# Patient Record
Sex: Male | Born: 1940 | Race: White | Hispanic: No
Health system: Southern US, Community
[De-identification: ages and names within clinical notes are randomized; demographics above are authoritative.]

---

## 2000-11-08 ENCOUNTER — Observation Stay (HOSPITAL_COMMUNITY): Admission: EM | Admit: 2000-11-08 | Discharge: 2000-11-09 | Payer: Self-pay

## 2002-11-08 ENCOUNTER — Ambulatory Visit (HOSPITAL_COMMUNITY): Admission: RE | Admit: 2002-11-08 | Discharge: 2002-11-08 | Payer: Self-pay | Admitting: Gastroenterology

## 2010-05-19 ENCOUNTER — Emergency Department (HOSPITAL_COMMUNITY): Payer: Medicare Other

## 2010-05-19 ENCOUNTER — Inpatient Hospital Stay (HOSPITAL_COMMUNITY)
Admission: EM | Admit: 2010-05-19 | Discharge: 2010-05-21 | DRG: 312 | Disposition: A | Discharge status: Home / Self Care | Payer: Medicare Other | Attending: Cardiology | Admitting: Cardiology

## 2010-05-19 DIAGNOSIS — K219 Gastro-esophageal reflux disease without esophagitis: Secondary | ICD-10-CM | POA: Diagnosis present

## 2010-05-19 DIAGNOSIS — D696 Thrombocytopenia, unspecified: Secondary | ICD-10-CM | POA: Diagnosis present

## 2010-05-19 DIAGNOSIS — R55 Syncope and collapse: Principal | ICD-10-CM | POA: Diagnosis present

## 2010-05-19 DIAGNOSIS — E785 Hyperlipidemia, unspecified: Secondary | ICD-10-CM | POA: Diagnosis present

## 2010-05-19 DIAGNOSIS — I10 Essential (primary) hypertension: Secondary | ICD-10-CM | POA: Diagnosis present

## 2010-05-19 DIAGNOSIS — Z79899 Other long term (current) drug therapy: Secondary | ICD-10-CM

## 2010-05-19 DIAGNOSIS — I4891 Unspecified atrial fibrillation: Secondary | ICD-10-CM | POA: Diagnosis present

## 2010-05-19 DIAGNOSIS — I498 Other specified cardiac arrhythmias: Secondary | ICD-10-CM | POA: Diagnosis present

## 2010-05-19 DIAGNOSIS — Z7982 Long term (current) use of aspirin: Secondary | ICD-10-CM

## 2010-05-19 DIAGNOSIS — R7989 Other specified abnormal findings of blood chemistry: Secondary | ICD-10-CM

## 2010-05-19 LAB — COMPREHENSIVE METABOLIC PANEL
BUN: 12 mg/dL (ref 6–23)
CO2: 26 mEq/L (ref 19–32)
Calcium: 8.8 mg/dL (ref 8.4–10.5)
Chloride: 107 mEq/L (ref 96–112)
Creatinine, Ser: 0.95 mg/dL (ref 0.4–1.5)
GFR calc non Af Amer: >60 mL/min (ref >60)
Glucose, Bld: 119 mg/dL — ABNORMAL HIGH (ref 70–99)
Total Bilirubin: 0.4 mg/dL (ref 0.3–1.2)

## 2010-05-19 LAB — URINALYSIS, ROUTINE W REFLEX MICROSCOPIC
Hgb urine dipstick: NEGATIVE
Nitrite: NEGATIVE
Specific Gravity, Urine: 1.017 (ref 1.005–1.030)
Urobilinogen, UA: 0.2 mg/dL (ref 0.0–1.0)

## 2010-05-19 LAB — DIFFERENTIAL
Basophils Absolute: 0 10*3/uL (ref 0.0–0.1)
Basophils Relative: 1 % (ref 0–1)
Eosinophils Relative: 2 % (ref 0–5)
Lymphocytes Relative: 28 % (ref 12–46)
Neutro Abs: 4 10*3/uL (ref 1.7–7.7)

## 2010-05-19 LAB — CBC
HCT: 45.4 % (ref 39.0–52.0)
RDW: 14.1 % (ref 11.5–15.5)
WBC: 6.5 10*3/uL (ref 4.0–10.5)

## 2010-05-19 LAB — BASIC METABOLIC PANEL
CO2: 28 mEq/L (ref 19–32)
Calcium: 9.1 mg/dL (ref 8.4–10.5)
Creatinine, Ser: 0.93 mg/dL (ref 0.4–1.5)
GFR calc Af Amer: >60 mL/min (ref >60)
GFR calc non Af Amer: >60 mL/min (ref >60)
Sodium: 143 mEq/L (ref 135–145)

## 2010-05-19 LAB — BRAIN NATRIURETIC PEPTIDE: Pro B Natriuretic peptide (BNP): 64 pg/mL (ref 0.0–100.0)

## 2010-05-19 LAB — CK TOTAL AND CKMB (NOT AT ARMC)
CK, MB: 2.5 ng/mL (ref 0.3–4.0)
Relative Index: 2.4 (ref 0.0–2.5)
Total CK: 106 U/L (ref 7–232)

## 2010-05-19 LAB — POCT CARDIAC MARKERS
CKMB, poc: 1.5 ng/mL (ref 1.0–8.0)
Troponin i, poc: 0.09 ng/mL (ref 0.00–0.09)

## 2010-05-19 LAB — CARDIAC PANEL(CRET KIN+CKTOT+MB+TROPI)
CK, MB: 2.3 ng/mL (ref 0.3–4.0)
Total CK: 106 U/L (ref 7–232)

## 2010-05-20 DIAGNOSIS — I517 Cardiomegaly: Secondary | ICD-10-CM

## 2010-05-20 DIAGNOSIS — R55 Syncope and collapse: Secondary | ICD-10-CM

## 2010-05-20 LAB — BASIC METABOLIC PANEL WITH GFR
BUN: 15 mg/dL (ref 6–23)
CO2: 24 meq/L (ref 19–32)
Calcium: 9.3 mg/dL (ref 8.4–10.5)
Chloride: 108 meq/L (ref 96–112)
Creatinine, Ser: 1.03 mg/dL (ref 0.4–1.5)
GFR calc non Af Amer: >60 mL/min
Glucose, Bld: 94 mg/dL (ref 70–99)
Potassium: 4 meq/L (ref 3.5–5.1)
Sodium: 142 meq/L (ref 135–145)

## 2010-05-20 LAB — CARDIAC PANEL(CRET KIN+CKTOT+MB+TROPI)
CK, MB: 1.8 ng/mL (ref 0.3–4.0)
CK, MB: 1.9 ng/mL (ref 0.3–4.0)
Relative Index: 1.7 (ref 0.0–2.5)
Total CK: 114 U/L (ref 7–232)
Troponin I: 0.05 ng/mL (ref 0.00–0.06)

## 2010-05-20 LAB — HEMOGLOBIN A1C: Mean Plasma Glucose: 105 mg/dL (ref <117)

## 2010-05-20 LAB — CBC
HCT: 42.7 % (ref 39.0–52.0)
MCH: 30.9 pg (ref 26.0–34.0)
MCV: 88 fL (ref 78.0–100.0)
Platelets: 137 10*3/uL — ABNORMAL LOW (ref 150–400)
RBC: 4.85 MIL/uL (ref 4.22–5.81)
RDW: 14.3 % (ref 11.5–15.5)
WBC: 6.1 10*3/uL (ref 4.0–10.5)

## 2010-05-20 LAB — HEPARIN LEVEL (UNFRACTIONATED): Heparin Unfractionated: 0.81 IU/mL — ABNORMAL HIGH (ref 0.30–0.70)

## 2010-05-22 ENCOUNTER — Telehealth: Payer: Self-pay | Admitting: Internal Medicine

## 2010-05-25 NOTE — Discharge Summary (Signed)
[name], [name]               ACCOUNT NO.:  [id number]  MEDICAL RECORD NO.:  [id number]           PATIENT TYPE:  I  LOCATION:  4704                         FACILITY:  [facility]  PHYSICIAN:  Melvin Canning. Ladona Ridgel, MD    DATE OF BIRTH:  December 04, 1940  DATE OF ADMISSION:  05/19/2010 DATE OF DISCHARGE:  05/21/2010                              DISCHARGE SUMMARY   PRIMARY CARDIOLOGIST:  Melvin Canning. Ladona Ridgel, MD  PRIMARY CARE PHYSICIAN:  Melvin Penna, MD.  DISCHARGE DIAGNOSES: 1. Syncope (unknown etiology).     a.     Sinus bradycardia down to 44 bpm on telemetry without      significant pauses.     b.     2-D echocardiogram, May 20, 2010:  LV cavity size      normal, moderate LVH, LVEF 55%- 60%, normal wall motion (no      regional wall motion abnormalities), grade 1 diastolic      dysfunction.  Moderately calcified aortic valve leaflets, but no      stenosis.  Left atrium mildly dilated.  RV cavity size normal with  normal systolic function.  Right atrium mildly dilated.  Peak RV-      RA gradient 18 mmHg.  PA systolic pressure 24-28 mmHg.  RA      pressures 6-10 mmHg.     c.     Carotid Dopplers with no evidence of ICA stenosis      bilaterally.     d.     Loop recorder to be set up as an outpatient (the patient to      be contacted by [facility]). 2. Mild elevation of troponin-I (question demand ischemia).     a.     Initial point-of-care markers negative, first full set of      enzymes, CK and MB within normal limits, troponin-I 0.10, two      follow-up sets of enzymes within normal limits. 3. Question history of atrial fibrillation.     a.     No evidence of atrial fibrillation at this admission,      continue preadmission meds. 4. Hypertension, BP on admission 161/94, peak systolic blood pressure     157 after admission, peak diastolic 94.  No med changes at this     time with most BP checks with systolic less than 140 and diastolic     less than 80. 5. Thrombocytopenia  (platelet count 137).  SECONDARY DIAGNOSES: 1. Dyslipidemia. 2. Gastroesophageal reflux disease/esophagitis. 3. Status post tendon surgery, 2010. 4. Status post cholecystectomy.  ALLERGIES AND INTOLERANCES:  NKDA.  PROCEDURES: 1. EKG, May 19, 2010:  Sinus bradycardia, 58 bpm, normal axis,     intervals within normal limits, no significant ST-T wave changes. 2. Chest x-ray, May 19, 2010:  No acute abnormalities, mild     changes consistent with COPD. 3. 2-D echocardiogram, May 20, 2010:  Please see discharge     diagnoses section under #1. 4. Carotid artery Dopplers, May 20, 2010:  Please see discharge     diagnoses section under #1.  HISTORY OF PRESENT ILLNESS:  Mr. [name] is a 70 year old Caucasian gentleman with the above-noted complex medical history, who presented to [facility] ED for further evaluation after having a syncopal episode while getting out of his truck after a long day of hunting coons.  The patient reports having an episode 4 years ago where he was chopped in the wood and had loss of consciousness.  He has occasionally had presyncope with heart fluttering since that time, but no frank syncope. Unfortunately, after a long day of hunting racoon's of his friends, he had driven home and was getting out of his truck when he had sudden onset of syncope without warning nor any post-syncopal symptoms.  He was unconscious for approximately 1 minute per witness report (hunting buddies).  He had no preceding chest discomfort, shortness of breath, heart fluttering/palpitations.  He had no biting of tongue or loss of bowel or bladder nor postictal state.  He told his daughter about this incident, who took him to his primary care doctor.  He was then sent to [facility] ED.  There EKG which showed no acute changes.  Vital signs only significant for mild hypertension.  There is documentation in  E-chart that the patient having a troponin of 0.18, but this is not  corroborated by any other area in E-chart (labs showed max troponin of 0.10 after negative set of point-of-care markers initially drawn).  HOSPITAL COURSE:  The patient was admitted and noted to have mild troponin elevation as above, but at no time did he have any chest discomfort or shortness of breath.  The patient had 2-D echocardiogram completed that was essentially normal with no wall motion abnormalities and normal right and left ventricular systolic function.  The patient was noted to have a bruit on physical exam and therefore had carotid Dopplers completed that showed no evidence of ICA stenosis bilaterally. The patient's telemetry was reviewed carefully by electrophysiologist, Dr. Lewayne Cochran with no significant findings (sinus bradycardia at nadir of 44 bpm and no significant pauses).  Due to the patient's episodes of syncope, we instructed him not to drive for 6 months.  He will also be instructed to call [facility] once he discuss this when would be an appropriate time to have his loop recorder implanted with his daughter.  At the time of discharge, the patient received his preadmission med list unchanged, follow-up instructions, and all questions and concerns were addressed prior to leaving the hospital.  DISCHARGE LABS:  WBC is 6.1, HGB is 15.0, HCT is 42.7, PLT count is 137. D-dimer 0.33.  Sodium 142, potassium 4.0, chloride 108, bicarb 24, BUN 15, creatinine 1.03, glucose 94.  Liver function tests within normal limits.  Albumin is 3.8, calcium is 9.3.  Hemoglobin A1c is 5.3%. Initial set of point-of-care markers negative.  First full set of enzymes, CK 106, MB 2.3, troponin 0.10.  Second full set, CK 99, MB 1.8, troponin 0.05.  Third full sets also within normal limits.  BNP 64.  TSH 2.915.  Urinalysis within normal limits.  FOLLOWUP PLANS AND APPOINTMENTS:  Please see hospital course.  DISCHARGE MEDICATIONS: 1. Enteric-coated aspirin 325 mg p.o. daily. 2.  Diltiazem CD 180 mg p.o. nightly. 3. Omeprazole 20 mg 1 capsule p.o. nightly.  DURATION OF DISCHARGE ENCOUNTER INCLUDING PHYSICIAN TIME:  35 minutes.     Jarrett Ables, PAC   ______________________________ Melvin Canning. Ladona Ridgel, MD    MS/MEDQ  D:  05/21/2010  T:  05/22/2010  Job:  [id number]  cc:   [name],  M.D. Melvin Canning. Ladona Ridgel, MD  Electronically Signed by Jarrett Ables PAC on 05/22/2010 11:03:42 AM Electronically Signed by Melvin Bunting MD on 05/25/2010 03:39:00 PM

## 2010-05-27 NOTE — Progress Notes (Addendum)
Summary: pt wants to cxl surgery for monday  Phone Note Call from Patient   Caller: Patient [phone] Reason for Call: Talk to Nurse Summary of Call: pt states he has surgery scheduled for monday -he wants to cxl-he has figured out what the problem is Initial call taken by: Glynda Jaeger,  May 22, 2010 11:25 AM  Follow-up for Phone Call        [facility] Katina Dung, RN, BSN  May 22, 2010 11:54 AM --I talked with pt--pt states he is scheduled to have a "monitor put in on Monday"--he states he knows  why he passed out and if the doctor wants to know he can call him-pt was scheduled for loop recorder implant at 2pm 05/25/10--I have cancelled this with Arlys John in the main cath lab--I will forward this to Dr Ladona Ridgel for review     Appended Document: pt wants to cxl surgery for monday I also notified Trish that pt cancelled loop implant  for 05/25/10

## 2010-05-29 NOTE — H&P (Signed)
[name], [name] NO.:  [id number]  MEDICAL RECORD NO.:  [id number]           PATIENT TYPE:  I  LOCATION:  4704                         FACILITY:  [facility]  PHYSICIAN:  Marca Ancona, MD      DATE OF BIRTH:  07/21/1940  DATE OF ADMISSION:  05/19/2010 DATE OF DISCHARGE:                             HISTORY & PHYSICAL   PRIMARY CARDIOLOGIST:  Evaluated by Dr. Ladona Ridgel in 2002.  PRIMARY CARE PHYSICIAN:  Dr. Christell Constant.  REASON FOR HOSPITALIZATION:  Syncopal episode with elevated troponins.  HISTORY OF PRESENT ILLNESS:  This is a 70 year old gentleman without known coronary artery disease with history of paroxysmal atrial fibrillation and dyslipidemia who states he was in his usual state of health until yesterday when he had a syncopal episode.  The patient had been [place] hunting all day, walking greater than 500 yards without difficulty.  He did complain of a headache.  After driving back home, he had a sudden syncopal episode while getting out of his truck. The patient was unresponsive for approximately 1 minute.  This is per his "hunting buddies".  He had no prodromal symptoms.  He also had no postictal symptoms, no loss of bowel or difficulty findings words.  The patient says he did feel poorly afterwards, but this resolved in a short period of time and therefore did not go for evaluation.  He denied any associated symptoms of chest pain, shortness of breath or palpitations. He does state that this also happened approximately 4 years ago while he was chopping wood.  He had no workup at this time.  The patient also states that he has occasional spells of lightheadedness.  They are associated with his heart fluttering but no frank syncopal episode. These are relieved on their own.    His daughter took him to see his primary care physician this morning and he was sent via EMS to [facility] for further evaluation.  The patient's initial troponin is elevated at 0.18.   His EKG demonstrates normal sinus rhythm without acute changes.  He is currently pain free and feels fine.  Of note, the patient's daughter states she checked his pulse rate after the syncopal episode yesterday and was noted to have irregular pulse approximately 80 beats per minute with pauses.  On the way from the primary care physician, the patient did receive a 500 mL bolus and is currently hemodynamically stable. Cardiology was asked to admit the patient for further evaluation.  PAST MEDICAL HISTORY: 1. Paroxysmal atrial fibrillation, on diltiazem and aspirin. 2. Paroxysmal atrial tachycardia. 3. Dyslipidemia. 4. Gastroesophageal reflux disease/esophagitis. 5. Status post tendon surgery in 2010 6. Status post cholecystectomy.  SOCIAL HISTORY:  The patient lives in [place].  He denies any tobacco, alcohol or illicit drug use.  He does exercise while hunting.  FAMILY HISTORY:  Noncontributory for early coronary artery disease.  He does have 2 brothers, two are deceased, one by stroke and one by cancer. His daughter has recently been diagnosed with mitral valve prolapse and she also has atrial fibrillation.  ALLERGIES: 1. STATINS causing severe myalgias. 2. LIDOCAINE causing syncope.  HOME MEDICATIONS: 1. Diltiazem 180 mg 1 tablet daily. 2. Antara 130 mg daily. 3. Omeprazole 20 mg daily. 4. Aspirin 325 mg daily.  REVIEW OF SYSTEMS:  All pertinent positives and negatives as stated in the HPI.  All other systems have been reviewed and are negative.  The patient denies any visual changes, weakness or numbness.  PHYSICAL EXAMINATION:  VITAL SIGNS:  Temperature 97.9, pulse 72, respirations 16, blood pressure 161/94, O2 saturation 100% on room air. GENERAL:  This is a well developed, well nourished elderly gentleman. He is in no acute distress.  He appears his stated age. HEENT:  Normal. NECK:  Supple.  Positive right bruits.  Positive elevation of JVP of 8-9 cm. HEART:   Distant heart sounds, bradycardic with S1 and S2.  No murmur, rub or gallop noted.  PMI is normal.  Pulses are 2+ and equal bilaterally. LUNGS:  Clear to auscultation bilaterally without wheezes, rales or rhonchi. ABDOMEN:  Soft, nontender, positive bowel sounds x4. EXTREMITIES:  No clubbing, cyanosis or edema noted. MUSCULOSKELETAL:  No joint deformities or effusions. NEURO:  Alert and oriented x3.  Cranial nerves II through XII grossly intact.  Chest x-ray showed no acute abnormalities.  There are mild changes consistent with COPD.  There are multiple small, mid high densities compatible with pleural fragments.  EKG showing normal sinus rhythm at a rate of 58 beats per minute.  Axis is normal.  Intervals are normal. There are no acute ST-T wave changes.  WBC 6.5, hemoglobin 15.8, hematocrit 45.4, platelets 149.  Sodium 143, potassium 4.2, BUN 13, creatinine 0.93.  Initial point-of-care markers are negative.  Troponin I 0.18, CK 106, CK-MB 2.5.  ASSESSMENT AND PLAN:  This is a 70 year old gentleman with history of paroxysmal atrial fibrillation and dyslipidemia who presents for evaluation of syncope. 1. Syncope.  With the patient's sudden onset of a syncopal episode     there is concern for arrhythmic etiology, bradycardia versus     tachycardia given the patient's history.  With the patient's     troponin being mildly elevated, there is evidence for a true event.     The patient will be admitted to telemetry overnight and if this is     unremarkable, then he will be set up with a Holter monitor.     We will obtain an echocardiogram today for left ventricular     function.  It is interesting that his JVP appears up.  We will also     check a BNP.  With the patient having history of atrial ablation     and CHADS-VAS score of 3 (age, hypertension, and vascular disease:     carotid bruit), if atrial fibrillation is documented, then likely     we will place the patient on Pradaxa  versus Coumadin.  This can be     discussed in further details.  We will continue diltiazem at this     time. 2. Elevated troponins.  The patient is currently without chest pain or     dyspnea on exertion, as his active includes hunting.  Question if     troponin is elevated due to demand ischemia from arrhythmic event     with hypertension.  The patient will be continued on aspirin and      heparin drip will be initiated for now.  We will check 2-D echocardiogram      for wall motion abnormalities as well as cycle cardiac enzymes. 3. Carotid bruits.  This  appears new and we will get carotid Dopplers     for further evaluation.  Further treatment will be dependent upon the above.     Leonette Monarch, PA-C   ______________________________ Marca Ancona, MD    NB/MEDQ  D:  05/19/2010  T:  05/20/2010  Job:  [id number]  cc:   Dr. Ladona Ridgel Dr. Christell Constant  Electronically Signed by Alen Blew P.A. on 05/27/2010 10:14:24 AM Electronically Signed by Marca Ancona MD on 05/29/2010 10:38:02 AM

## 2011-03-05 ENCOUNTER — Ambulatory Visit: Payer: Medicare Other | Admitting: Cardiovascular Disease

## 2011-06-22 ENCOUNTER — Telehealth: Payer: Self-pay | Admitting: Internal Medicine

## 2011-06-22 ENCOUNTER — Encounter: Payer: Self-pay | Admitting: Internal Medicine

## 2011-06-22 NOTE — Telephone Encounter (Signed)
06-22-11 sent pt past due letter, needs pacer ck with taylor/mt

## 2011-11-25 ENCOUNTER — Telehealth: Payer: Self-pay | Admitting: Internal Medicine

## 2011-11-25 ENCOUNTER — Encounter: Payer: Self-pay | Admitting: Internal Medicine

## 2011-11-25 NOTE — Telephone Encounter (Signed)
11-25-11 sent past due letter pacer ck with taylor/mt

## 2012-01-26 ENCOUNTER — Other Ambulatory Visit (HOSPITAL_COMMUNITY): Payer: Medicare Other

## 2012-01-26 ENCOUNTER — Other Ambulatory Visit: Payer: Self-pay | Admitting: Family Medicine

## 2012-01-26 ENCOUNTER — Ambulatory Visit (HOSPITAL_COMMUNITY)
Admission: RE | Admit: 2012-01-26 | Discharge: 2012-01-26 | Disposition: A | Discharge status: Home / Self Care | Payer: Medicare Other | Source: Ambulatory Visit | Attending: Family Medicine | Admitting: Family Medicine

## 2012-01-26 DIAGNOSIS — R079 Chest pain, unspecified: Secondary | ICD-10-CM | POA: Insufficient documentation

## 2012-01-26 DIAGNOSIS — S0003XA Contusion of scalp, initial encounter: Secondary | ICD-10-CM | POA: Insufficient documentation

## 2012-01-26 DIAGNOSIS — S1093XA Contusion of unspecified part of neck, initial encounter: Secondary | ICD-10-CM | POA: Insufficient documentation

## 2012-01-26 DIAGNOSIS — T1490XA Injury, unspecified, initial encounter: Secondary | ICD-10-CM

## 2012-01-26 DIAGNOSIS — R51 Headache: Secondary | ICD-10-CM | POA: Insufficient documentation

## 2012-01-26 DIAGNOSIS — W1809XA Striking against other object with subsequent fall, initial encounter: Secondary | ICD-10-CM | POA: Insufficient documentation

## 2012-01-26 DIAGNOSIS — M25519 Pain in unspecified shoulder: Secondary | ICD-10-CM | POA: Insufficient documentation

## 2012-09-25 ENCOUNTER — Ambulatory Visit: Payer: Self-pay | Admitting: Family Medicine

## 2012-11-22 ENCOUNTER — Other Ambulatory Visit: Payer: Self-pay | Admitting: *Deleted

## 2012-11-22 MED ORDER — OMEPRAZOLE 20 MG PO CPDR: 20.0000 mg | DELAYED_RELEASE_CAPSULE | Freq: Every day | ORAL | Status: AC

## 2012-11-22 MED ORDER — DILTIAZEM HCL ER COATED BEADS 180 MG PO CP24: 180.0000 mg | ORAL_CAPSULE | Freq: Every day | ORAL | Status: AC

## 2013-10-02 ENCOUNTER — Other Ambulatory Visit: Payer: Self-pay | Admitting: Family Medicine

## 2013-11-17 IMAGING — CR DG FOOT COMPLETE 3+V*L*
3 series · 3 of 3 positions shown · non-contrast
Comparison: None.

CLINICAL DATA: Heel pain, fourth toe pain.

LEFT FOOT - COMPLETE 3+ VIEW

[view not recorded (1 of 3)]
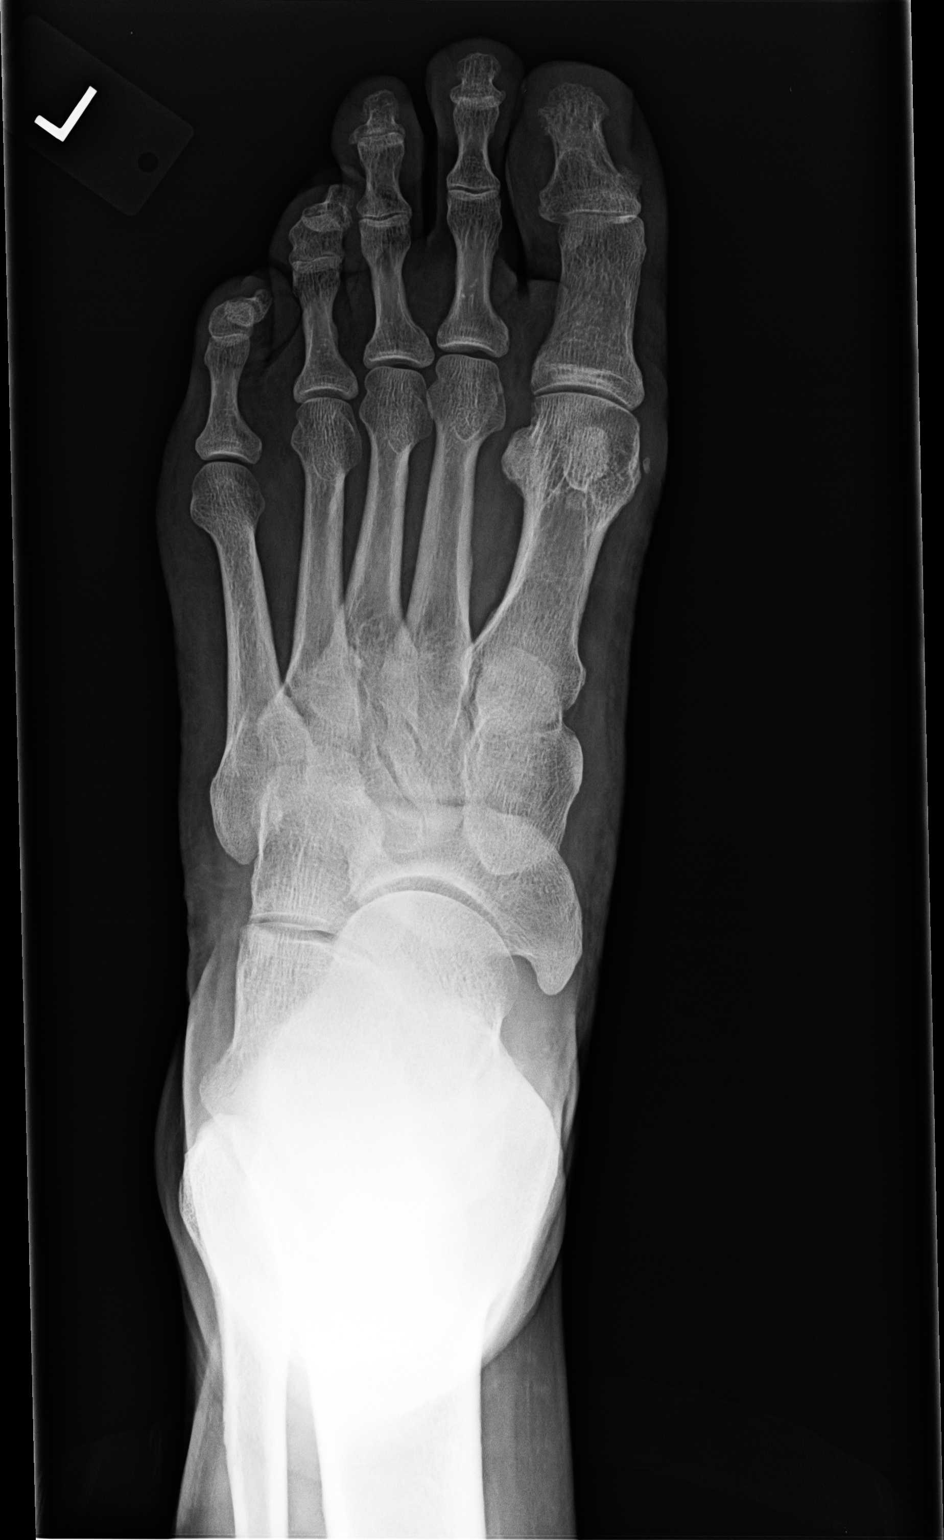

[view not recorded (2 of 3)]
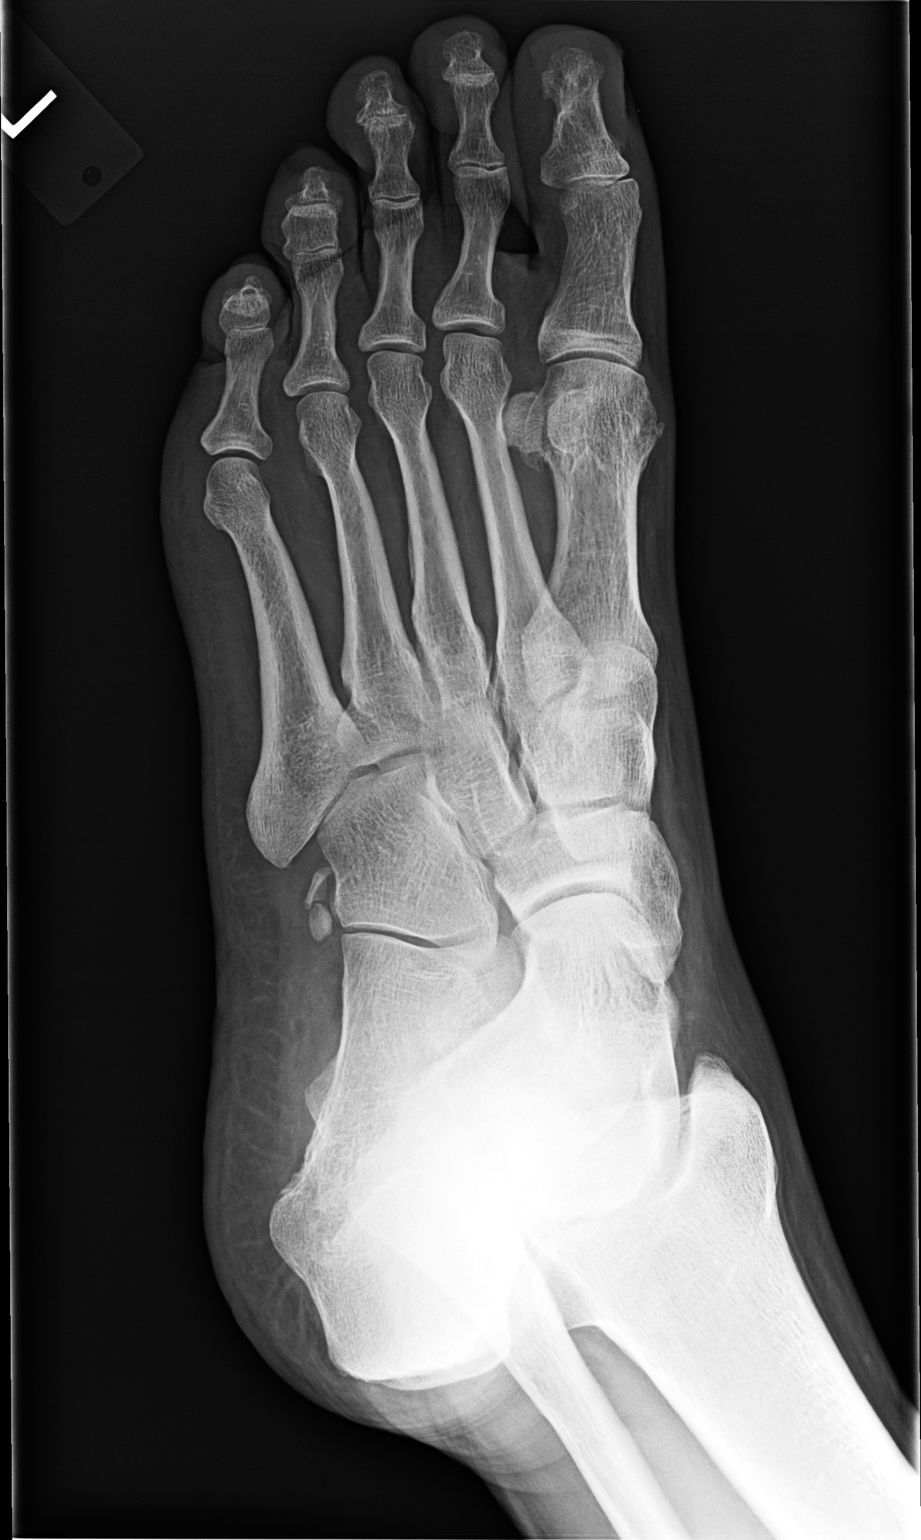

[view not recorded (3 of 3)]
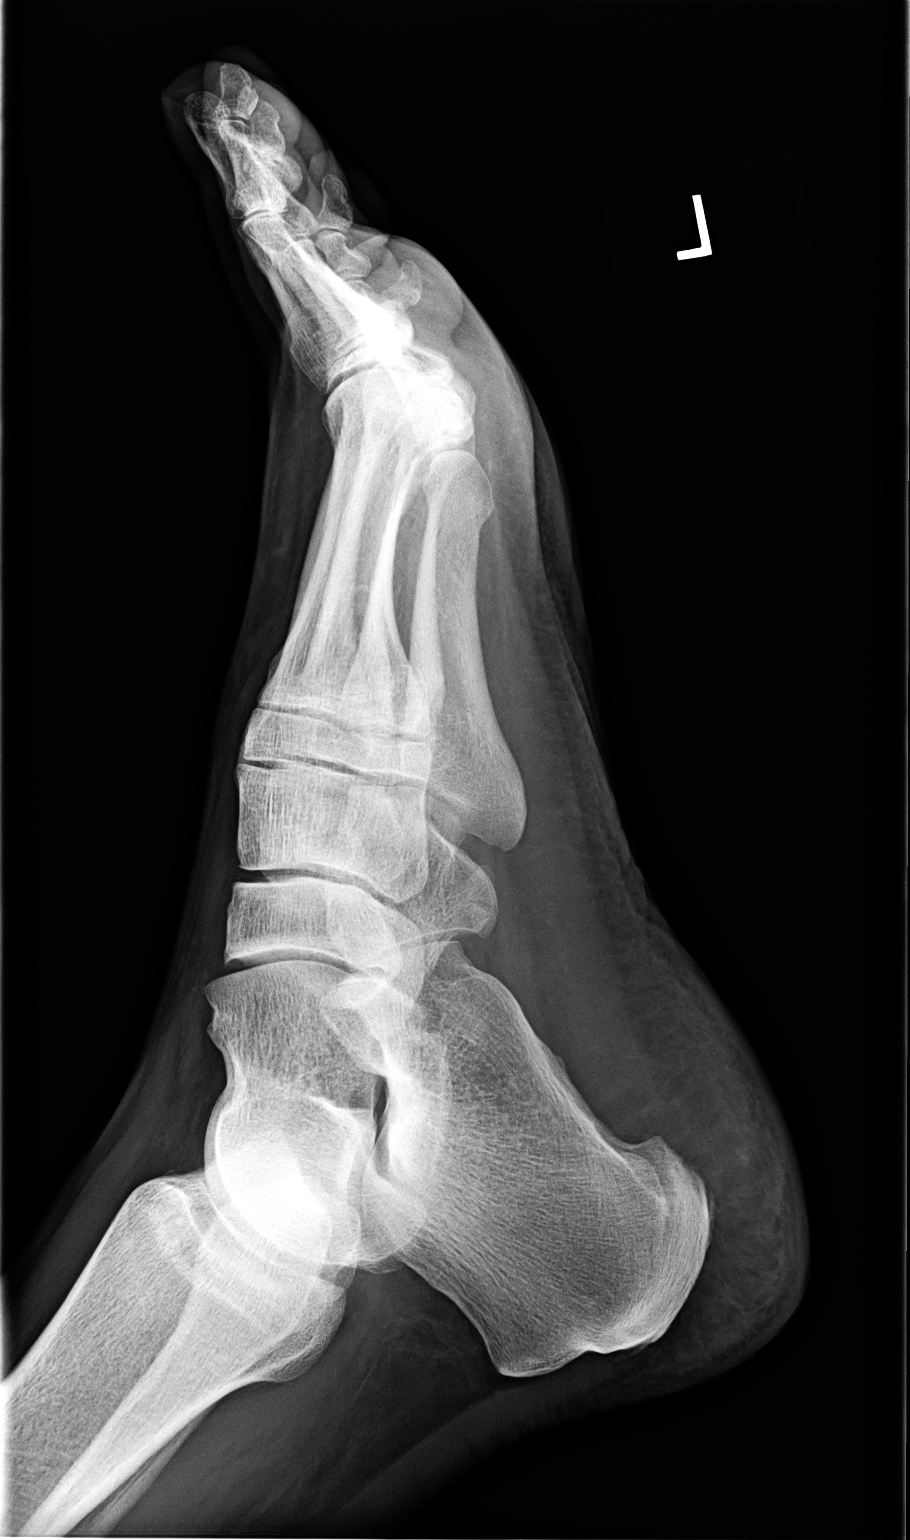

[3 of 3 positions shown; findings below may reference images not displayed]

FINDINGS: Early degenerative changes in the first MTP and IP joint
with joint space narrowing. No acute bony abnormality.
Specifically, no fracture, subluxation, or dislocation.  Soft
tissues are intact.
IMPRESSION: No acute bony abnormality.

## 2014-10-18 ENCOUNTER — Other Ambulatory Visit: Payer: Self-pay | Admitting: Family Medicine

## 2020-06-13 HISTORY — PX: TOTAL KNEE ARTHROPLASTY: SHX125

## 2020-06-25 ENCOUNTER — Other Ambulatory Visit: Payer: Self-pay

## 2020-06-25 ENCOUNTER — Encounter: Payer: Self-pay | Admitting: Physical Therapy

## 2020-06-25 ENCOUNTER — Ambulatory Visit: Payer: Medicare HMO | Attending: Orthopedic Surgery | Admitting: Physical Therapy

## 2020-06-25 DIAGNOSIS — M6281 Muscle weakness (generalized): Secondary | ICD-10-CM | POA: Insufficient documentation

## 2020-06-25 DIAGNOSIS — M25662 Stiffness of left knee, not elsewhere classified: Secondary | ICD-10-CM | POA: Diagnosis present

## 2020-06-25 DIAGNOSIS — R600 Localized edema: Secondary | ICD-10-CM | POA: Diagnosis present

## 2020-06-25 DIAGNOSIS — M25562 Pain in left knee: Secondary | ICD-10-CM

## 2020-06-25 NOTE — Therapy (Signed)
[facility] [address] [place], [place], [address] Phone: [phone]   Fax:  [phone]  Physical Therapy Evaluation  Patient Details  Name: [name] MRN: [id number] Date of Birth: 1940/06/08 Referring Provider (PT): Gean Birchwood, MD   Encounter Date: 06/25/2020   PT End of Session - 06/25/20 1216    Visit Number 1    Number of Visits 12    Date for PT Re-Evaluation 08/13/20    Authorization Type [employer] (CQ and KX modifier); FOTO; Progress note every 10th visit    PT Start Time 1030    PT Stop Time 1114    PT Time Calculation (min) 44 min    Equipment Utilized During Treatment Other (comment)   rolling walker   Activity Tolerance Patient tolerated treatment well    Behavior During Therapy [facility] for tasks assessed/performed           History reviewed. No pertinent past medical history.  Past Surgical History:  Procedure Laterality Date  . 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    Subjective Assessment - 06/25/20 1157    Subjective COVID-19 screening performed upon arrival. Patient arrives to physical therapy with left knee pain, decreased left knee ROM, and difficulty walking secondary to a left TKA on 06/13/2020. Patient reports having home health PT with reports of being consistent with HEP. Patient reports intermittently walking without his walker but holds onto walls. Patient reports pain at worst as severe and pain at best as 0/10. Patient's goals are to decrease pain, improve movement, and improve ability to perform ADLs and home activities independently.    Pertinent History L TKA 06/13/2020    Limitations Walking;Standing;House hold activities;Sitting    Patient Stated Goals get around better,    Currently in Pain? Yes    Pain Score 5     Pain Location Knee    Pain Orientation Left    Pain Descriptors / Indicators Sore    Pain Type Surgical pain    Pain  Onset 1 to 4 weeks ago    Pain Frequency Constant    Aggravating Factors  "bumping it"    Pain Relieving Factors "sitting down, tylenol"    Effect of Pain on Daily Activities "can't do everything like I used to"              [facility] PT Assessment - 06/25/20 0001      Assessment   Medical Diagnosis S/P left total knee replacement    Referring Provider (PT) Gean Birchwood, MD    Onset Date/Surgical Date 06/13/20    Next MD Visit April 2022    Prior Therapy no      Precautions   Precautions Other (comment)    Precaution Comments no ultrasound      Restrictions   Weight Bearing Restrictions No      Balance Screen   Has the patient fallen in the past 6 months No    Has the patient had a decrease in activity level because of a fear of falling?  No    Is the patient reluctant to leave their home because of a fear of falling?  No      Home Environment   Living Environment Private residence    Type of Home House    Home Access Stairs to enter    Entrance Stairs-Number of Steps 2      Prior Function   Level  of Independence Independent with basic ADLs      Observation/Other Assessments   Focus on Therapeutic Outcomes (FOTO)  59% limitation      ROM / Strength   AROM / PROM / Strength AROM;PROM      AROM   Overall AROM  Deficits;Due to pain    AROM Assessment Site Knee    Right/Left Knee Left    Left Knee Extension 10    Left Knee Flexion 88      PROM   Overall PROM  Deficits;Due to pain    PROM Assessment Site Knee    Right/Left Knee Left    Left Knee Extension 8    Left Knee Flexion 95      Palpation   Patella mobility decreased patella mobility in all planes    Palpation comment tender to palpations throughout L knee      Transfers   Comments slow transfers sit <> stand; slow sit <> supine transfers, able to raise left LE onto table without assistance.      Ambulation/Gait   Assistive device Rolling walker    Gait Pattern Step-to pattern;Decreased step length -  right;Decreased stance time - left;Decreased stride length;Decreased hip/knee flexion - left;Decreased dorsiflexion - left;Decreased weight shift to left;Left flexed knee in stance;Antalgic;Trunk flexed                      Objective measurements completed on examination: See above findings.       [facility] Adult PT Treatment/Exercise - 06/25/20 0001      Modalities   Modalities Vasopneumatic      Vasopneumatic   Number Minutes Vasopneumatic  15 minutes    Vasopnuematic Location  Knee    Vasopneumatic Pressure Low    Vasopneumatic Temperature  34 pain and edema                  PT Education - 06/25/20 1214    Education Details Continue HEP provided by home health PT, more frequently 2-3x per day    Person(s) Educated Patient    Methods Explanation    Comprehension Verbalized understanding            PT Short Term Goals - 06/25/20 1305      PT SHORT TERM GOAL #1   Title Patient will be independent with initial HEP    Time 3    Period Weeks    Status New      PT SHORT TERM GOAL #2   Title Patient will demonstrate 8-90+ degrees of left knee AROM to improve mobility during functional tasks    Time 3    Period Weeks    Status New             PT Long Term Goals - 06/25/20 1306      PT LONG TERM GOAL #1   Title Patient will be independent with advanced HEP    Time 6    Period Weeks    Status New      PT LONG TERM GOAL #2   Title Patient will demonstrate 5 degrees or less of left knee extension AROM to improve gait mechanics.    Time 6    Period Weeks    Status New      PT LONG TERM GOAL #3   Title Patient will demonstrate 115+ degrees of left knee flexion AROM to improve ability to perform functional tasks.    Time 6    Period  Weeks    Status New      PT LONG TERM GOAL #4   Title Patient will report ability to perform ADLs and home activities with left knee pain less than or equal to 3/10,    Time 6    Period Weeks    Status New       PT LONG TERM GOAL #5   Title Patient will demonstrate reciprocating stair negotiation with no AD and use of one railing to safely enter/exit home.    Time 6    Period Weeks    Status New                  Plan - 06/25/20 1217    Clinical Impression Statement Patient is an 80 year old male who presents to physical therapy with left knee pain, decreased left ROM, and increased edema secondary to a left TKA on 06/13/2020. Patient ambulates with a rolling walker with decreased L stance time, decreased L knee flexion during swing, and increased L knee flexion during stance. Patient with notable edema extending to lower leg with no compression stockings donned. Some erythema but no heat radiating from left LE. Patient and PT discussed plan of care, HEP, and importance of utilizing walker for safety to prevent falls. Patient reported understanding. Patient would benefit from skilled physical therapy to address deficits and address patient's goals.    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    Examination-Activity Limitations Bathing;Locomotion Level;Transfers;Sit;Squat;Stairs;Stand;Bend;Dressing;Hygiene/Grooming    Examination-Participation Restrictions Cleaning;Laundry    Stability/Clinical Decision Making Stable/Uncomplicated    Clinical Decision Making Low    Rehab Potential Good    PT Frequency 2x / week    PT Duration 6 weeks    PT Treatment/Interventions ADLs/Self Care Home Management;Cryotherapy;Electrical Stimulation;Moist Heat;Iontophoresis 4mg /ml Dexamethasone;Gait training;Stair training;Functional mobility training;Therapeutic activities;Therapeutic exercise;Balance training;Neuromuscular re-education;Manual techniques;Passive range of motion;Patient/family education;Vasopneumatic Device    PT Next Visit Plan Nustep, knee flexion and extension AROM and PROM. gait training, modalities PRN  for pain relief    PT Home Exercise Plan continue HEP from home health    Consulted and Agree with Plan of Care Patient           Patient will benefit from skilled therapeutic intervention in order to improve the following deficits and impairments:  Decreased activity tolerance,Decreased balance,Decreased strength,Increased edema,Difficulty walking,Decreased range of motion,Pain  Visit Diagnosis: Acute pain of left knee  Stiffness of left knee, not elsewhere classified  Localized edema  Muscle weakness (generalized)     Problem List There are no problems to display for this patient.   , PT, DPT 06/25/2020, 6:14 PM  [facility] [address] [place], [place], [place] Phone: [phone]   Fax:  [phone]  Name: [name] MRN: Toma Copier Date of Birth: 12-23-1940

## 2020-06-27 ENCOUNTER — Other Ambulatory Visit: Payer: Self-pay

## 2020-06-27 ENCOUNTER — Ambulatory Visit: Payer: Medicare HMO | Admitting: *Deleted

## 2020-06-27 DIAGNOSIS — M25562 Pain in left knee: Secondary | ICD-10-CM | POA: Diagnosis not present

## 2020-06-27 DIAGNOSIS — M6281 Muscle weakness (generalized): Secondary | ICD-10-CM

## 2020-06-27 DIAGNOSIS — R600 Localized edema: Secondary | ICD-10-CM

## 2020-06-27 DIAGNOSIS — M25662 Stiffness of left knee, not elsewhere classified: Secondary | ICD-10-CM

## 2020-06-27 NOTE — Therapy (Signed)
[facility] [address] [place], [place], [address] Phone: [phone]   Fax:  [phone]  Physical Therapy Treatment  Patient Details  Name: KIT MOLLETT MRN: [id number] Date of Birth: 18-Aug-1940 Referring Provider (PT): Gean Birchwood, MD   Encounter Date: 06/27/2020   PT End of Session - 06/27/20 0917    Visit Number 2    Number of Visits 12    Date for PT Re-Evaluation 08/13/20    PT Start Time 0900    PT Stop Time 0950    PT Time Calculation (min) 50 min           No past medical history on file.  Past Surgical History:  Procedure Laterality Date  . 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   Subjective Assessment - 06/27/20 0915    Subjective COVID-19 screening performed upon arrival.  My knee hurt all night    Pertinent History L TKA 06/13/2020    Limitations Walking;Standing;House hold activities;Sitting    Patient Stated Goals get around better,    Currently in Pain? Yes    Pain Score 5     Pain Location Knee    Pain Orientation Left    Pain Descriptors / Indicators Sore    Pain Type Surgical pain                             [facility] Adult PT Treatment/Exercise - 06/27/20 0001      Exercises   Exercises Knee/Hip      Knee/Hip Exercises: Aerobic   Nustep L1 seat 12,11 x15 mins      Knee/Hip Exercises: Standing   Rocker Board 3 minutes    Other Standing Knee Exercises 8in box toe taps x 10, 12in boxx10, 14 in box x10      Knee/Hip Exercises: Seated   Long Arc Quad AROM;Left;1 set;10 reps   5 sec holds     Modalities   Modalities Vasopneumatic      Vasopneumatic   Number Minutes Vasopneumatic  10 minutes    Vasopnuematic Location  Knee    Vasopneumatic Pressure Low    Vasopneumatic Temperature  34 pain and edema      Manual Therapy   Manual Therapy Passive ROM    Passive ROM PROM toLT knee  for extension and flexion, patella mobs,  scar massage                    PT Short Term Goals - 06/25/20 1305      PT SHORT TERM GOAL #1   Title Patient will be independent with initial HEP    Time 3    Period Weeks    Status New      PT SHORT TERM GOAL #2   Title Patient will demonstrate 8-90+ degrees of left knee AROM to improve mobility during functional tasks    Time 3    Period Weeks    Status New             PT Long Term Goals - 06/25/20 1306      PT LONG TERM GOAL #1   Title Patient will be independent with advanced HEP    Time 6    Period Weeks    Status New      PT LONG TERM GOAL #2   Title Patient will demonstrate 5 degrees or less  of left knee extension AROM to improve gait mechanics.    Time 6    Period Weeks    Status New      PT LONG TERM GOAL #3   Title Patient will demonstrate 115+ degrees of left knee flexion AROM to improve ability to perform functional tasks.    Time 6    Period Weeks    Status New      PT LONG TERM GOAL #4   Title Patient will report ability to perform ADLs and home activities with left knee pain less than or equal to 3/10,    Time 6    Period Weeks    Status New      PT LONG TERM GOAL #5   Title Patient will demonstrate reciprocating stair negotiation with no AD and use of one railing to safely enter/exit home.    Time 6    Period Weeks    Status New                 Plan - 06/27/20 0957    Clinical Impression Statement Pt arrived today doing fairly well with LT knee. Rx focused on ROM and LT LE strengthening. Vaso end of session.    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    Examination-Activity Limitations Bathing;Locomotion Level;Transfers;Sit;Squat;Stairs;Stand;Bend;Dressing;Hygiene/Grooming    Examination-Participation Restrictions Cleaning;Laundry    Stability/Clinical Decision Making Stable/Uncomplicated    Rehab Potential Good    PT  Frequency 2x / week    PT Duration 6 weeks    PT Treatment/Interventions ADLs/Self Care Home Management;Cryotherapy;Electrical Stimulation;Moist Heat;Iontophoresis 4mg /ml Dexamethasone;Gait training;Stair training;Functional mobility training;Therapeutic activities;Therapeutic exercise;Balance training;Neuromuscular re-education;Manual techniques;Passive range of motion;Patient/family education;Vasopneumatic Device    PT Next Visit Plan Nustep, knee flexion and extension AROM and PROM. gait training, modalities PRN for pain relief    PT Home Exercise Plan continue HEP from home health    Consulted and Agree with Plan of Care Patient           Patient will benefit from skilled therapeutic intervention in order to improve the following deficits and impairments:  Decreased activity tolerance,Decreased balance,Decreased strength,Increased edema,Difficulty walking,Decreased range of motion,Pain  Visit Diagnosis: Acute pain of left knee  Stiffness of left knee, not elsewhere classified  Localized edema  Muscle weakness (generalized)     Problem List There are no problems to display for this patient.   Natasja Niday,CHRIS, PTA 06/27/2020, 10:11 AM  [facility] [address] [place], [place], [place] Phone: [phone]   Fax:  [phone]  Name: JEET SHOUGH MRN: Toma Copier Date of Birth: Aug 14, 1940

## 2020-07-01 ENCOUNTER — Other Ambulatory Visit: Payer: Self-pay

## 2020-07-01 ENCOUNTER — Ambulatory Visit: Payer: Medicare HMO | Admitting: Physical Therapy

## 2020-07-01 DIAGNOSIS — M25662 Stiffness of left knee, not elsewhere classified: Secondary | ICD-10-CM

## 2020-07-01 DIAGNOSIS — R600 Localized edema: Secondary | ICD-10-CM

## 2020-07-01 DIAGNOSIS — M25562 Pain in left knee: Secondary | ICD-10-CM

## 2020-07-01 DIAGNOSIS — M6281 Muscle weakness (generalized): Secondary | ICD-10-CM

## 2020-07-01 NOTE — Therapy (Signed)
[facility] [address] [place], [place], [address] Phone: [phone]   Fax:  [phone]  Physical Therapy Treatment  Patient Details  Name: [name] MRN: [id number] Date of Birth: Jun 19, 1940 Referring Provider (PT): Gean Birchwood, MD   Encounter Date: 07/01/2020   PT End of Session - 07/01/20 1709    Visit Number 3    Number of Visits 12    Date for PT Re-Evaluation 08/13/20    Authorization Type [employer] (CQ and KX modifier); FOTO; Progress note every 10th visit     PT Start Time 1600    PT Stop Time 1648    PT Time Calculation (min) 48 min    Activity Tolerance Patient tolerated treatment well    Behavior During Therapy [facility] for tasks assessed/performed           No past medical history on file.  Past Surgical History:  Procedure Laterality Date  . 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   Subjective Assessment - 07/01/20 1613    Subjective COVID-19 screening performed upon arrival. Patient reports knee hurts all the time; reports "light" pain    Pertinent History L TKA 06/13/2020    Limitations Walking;Standing;House hold activities;Sitting    Currently in Pain? Yes   did not provide number on pain scale             [facility] PT Assessment - 07/01/20 0001      Assessment   Medical Diagnosis S/P left total knee replacement    Referring Provider (PT) Gean Birchwood, MD    Onset Date/Surgical Date 06/13/20    Next MD Visit April 2022    Prior Therapy no      Precautions   Precautions Other (comment)    Precaution Comments no ultrasound      Restrictions   Weight Bearing Restrictions No                         [facility] Adult PT Treatment/Exercise - 07/01/20 0001      Exercises   Exercises Knee/Hip      Knee/Hip Exercises: Aerobic   Nustep L2 seat 12,10 x15 mins      Knee/Hip Exercises: Standing   Forward Lunges Left;2 sets;10 reps;3  seconds    Forward Lunges Limitations 6" foward knee flexion    Hip Abduction AROM;Both;1 set;15 reps      Knee/Hip Exercises: Seated   Long Arc Quad AROM;Left;2 sets;10 reps   3" hold     Modalities   Modalities Vasopneumatic      Vasopneumatic   Number Minutes Vasopneumatic  10 minutes    Vasopnuematic Location  Knee    Vasopneumatic Pressure Low    Vasopneumatic Temperature  34 pain and edema      Manual Therapy   Manual Therapy Passive ROM    Passive ROM PROM to LT knee  for extension and flexion, patella mobs, scar massage, intermittent oscillations to decrease pain                    PT Short Term Goals - 06/25/20 1305      PT SHORT TERM GOAL #1   Title Patient will be independent with initial HEP    Time 3    Period Weeks    Status New      PT SHORT TERM GOAL #2   Title Patient will  demonstrate 8-90+ degrees of left knee AROM to improve mobility during functional tasks    Time 3    Period Weeks    Status New             PT Long Term Goals - 06/25/20 1306      PT LONG TERM GOAL #1   Title Patient will be independent with advanced HEP    Time 6    Period Weeks    Status New      PT LONG TERM GOAL #2   Title Patient will demonstrate 5 degrees or less of left knee extension AROM to improve gait mechanics.    Time 6    Period Weeks    Status New      PT LONG TERM GOAL #3   Title Patient will demonstrate 115+ degrees of left knee flexion AROM to improve ability to perform functional tasks.    Time 6    Period Weeks    Status New      PT LONG TERM GOAL #4   Title Patient will report ability to perform ADLs and home activities with left knee pain less than or equal to 3/10,    Time 6    Period Weeks    Status New      PT LONG TERM GOAL #5   Title Patient will demonstrate reciprocating stair negotiation with no AD and use of one railing to safely enter/exit home.    Time 6    Period Weeks    Status New                 Plan -  07/01/20 1644    Clinical Impression Statement Patient arrives to physical therapy with constant "light" pain in left knee. Patient guided through standing and seated exercises with good response. PROM to left knee into flexion and extension performed in sitting with good response and minimal guarding. Patient and PT discussed importance of establishing proper gait mechanics while walking with and without AD. Patient reported understanding. No adverse affects upon removal of modalities.    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    Examination-Activity Limitations Bathing;Locomotion Level;Transfers;Sit;Squat;Stairs;Stand;Bend;Dressing;Hygiene/Grooming    Examination-Participation Restrictions Cleaning;Laundry    Stability/Clinical Decision Making Stable/Uncomplicated    Clinical Decision Making Low    Rehab Potential Good    PT Frequency 2x / week    PT Duration 6 weeks    PT Treatment/Interventions ADLs/Self Care Home Management;Cryotherapy;Electrical Stimulation;Moist Heat;Iontophoresis 4mg /ml Dexamethasone;Gait training;Stair training;Functional mobility training;Therapeutic activities;Therapeutic exercise;Balance training;Neuromuscular re-education;Manual techniques;Passive range of motion;Patient/family education;Vasopneumatic Device    PT Next Visit Plan Nustep, knee flexion and extension AROM and PROM. gait training, modalities PRN for pain relief    PT Home Exercise Plan continue HEP from home health    Consulted and Agree with Plan of Care Patient           Patient will benefit from skilled therapeutic intervention in order to improve the following deficits and impairments:  Decreased activity tolerance,Decreased balance,Decreased strength,Increased edema,Difficulty walking,Decreased range of motion,Pain  Visit Diagnosis: Acute pain of left knee  Stiffness of left knee, not elsewhere  classified  Localized edema  Muscle weakness (generalized)     Problem List There are no problems to display for this patient.   , PT, DPT 07/01/2020, 5:09 PM  [facility] [address] [place], [place], [place] Phone: [phone]   Fax:  [phone]  Name: WANDELL SCULLION MRN: [id number] Date of Birth: 09-Oct-1940

## 2020-07-03 ENCOUNTER — Other Ambulatory Visit: Payer: Self-pay

## 2020-07-03 ENCOUNTER — Ambulatory Visit: Payer: Medicare HMO | Admitting: Physical Therapy

## 2020-07-03 DIAGNOSIS — M25562 Pain in left knee: Secondary | ICD-10-CM | POA: Diagnosis not present

## 2020-07-03 DIAGNOSIS — M6281 Muscle weakness (generalized): Secondary | ICD-10-CM

## 2020-07-03 DIAGNOSIS — M25662 Stiffness of left knee, not elsewhere classified: Secondary | ICD-10-CM

## 2020-07-03 DIAGNOSIS — R600 Localized edema: Secondary | ICD-10-CM

## 2020-07-03 NOTE — Therapy (Signed)
[facility] [address] [place], [place], [address] Phone: [phone]   Fax:  [phone]  Physical Therapy Treatment  Patient Details  Name: [name] MRN: [id number] Date of Birth: Jun 18, 1940 Referring Provider (PT): Gean Birchwood, MD   Encounter Date: 07/03/2020   PT End of Session - 07/03/20 1652    Visit Number 4    Number of Visits 12    Date for PT Re-Evaluation 08/13/20    Authorization Type [employer] (CQ and KX modifier); FOTO; Progress note every 10th visit     PT Start Time 1550    PT Stop Time 1640    PT Time Calculation (min) 50 min    Activity Tolerance Patient tolerated treatment well    Behavior During Therapy [facility] for tasks assessed/performed           No past medical history on file.  Past Surgical History:  Procedure Laterality Date  . 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   Subjective Assessment - 07/03/20 1724    Subjective COVID-19 screening performed upon arrival. Patient reports feeling no pain during the day, but pain keeps him up at night.    Pertinent History L TKA 06/13/2020    Limitations Walking;Standing;House hold activities;Sitting    Patient Stated Goals get around better,    Currently in Pain? No/denies              [facility] PT Assessment - 07/03/20 0001      Assessment   Medical Diagnosis S/P left total knee replacement    Referring Provider (PT) Gean Birchwood, MD    Onset Date/Surgical Date 06/13/20    Next MD Visit April 2022    Prior Therapy no      Precautions   Precautions Other (comment)    Precaution Comments no ultrasound                         [facility] Adult PT Treatment/Exercise - 07/03/20 0001      Exercises   Exercises Knee/Hip      Knee/Hip Exercises: Aerobic   Recumbent Bike attempted but terminated    Nustep L2 seat 12,10 x15 mins      Knee/Hip Exercises: Standing   Forward Lunges  Left;2 sets;10 reps;3 seconds    Forward Lunges Limitations 14" forward knee flexion    Rocker Board 3 minutes      Modalities   Modalities Vasopneumatic      Vasopneumatic   Number Minutes Vasopneumatic  10 minutes    Vasopnuematic Location  Knee    Vasopneumatic Pressure Low    Vasopneumatic Temperature  34 pain and edema      Manual Therapy   Manual Therapy Passive ROM    Passive ROM PROM to LT knee  for extension and flexion, patella mobs, scar massage, intermittent oscillations to decrease pain in sitting                    PT Short Term Goals - 06/25/20 1305      PT SHORT TERM GOAL #1   Title Patient will be independent with initial HEP    Time 3    Period Weeks    Status New      PT SHORT TERM GOAL #2   Title Patient will demonstrate 8-90+ degrees of left knee AROM to improve mobility during functional tasks  Time 3    Period Weeks    Status New             PT Long Term Goals - 06/25/20 1306      PT LONG TERM GOAL #1   Title Patient will be independent with advanced HEP    Time 6    Period Weeks    Status New      PT LONG TERM GOAL #2   Title Patient will demonstrate 5 degrees or less of left knee extension AROM to improve gait mechanics.    Time 6    Period Weeks    Status New      PT LONG TERM GOAL #3   Title Patient will demonstrate 115+ degrees of left knee flexion AROM to improve ability to perform functional tasks.    Time 6    Period Weeks    Status New      PT LONG TERM GOAL #4   Title Patient will report ability to perform ADLs and home activities with left knee pain less than or equal to 3/10,    Time 6    Period Weeks    Status New      PT LONG TERM GOAL #5   Title Patient will demonstrate reciprocating stair negotiation with no AD and use of one railing to safely enter/exit home.    Time 6    Period Weeks    Status New                 Plan - 07/03/20 1652    Clinical Impression Statement Patient arrived  doing well with no reports of pain currently in left knee. Patient attempted bike but unable to perform AAROM at this time. Patient responded well to rockerboard with good form and with march to 14" step with no reports of pain. PROM and patella mobilization performed with good response in sitting. No adverse affects upon removal    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    Examination-Activity Limitations Bathing;Locomotion Level;Transfers;Sit;Squat;Stairs;Stand;Bend;Dressing;Hygiene/Grooming    Examination-Participation Restrictions Cleaning;Laundry    Stability/Clinical Decision Making Stable/Uncomplicated    Clinical Decision Making Low    Rehab Potential Good    PT Frequency 2x / week    PT Duration 6 weeks    PT Treatment/Interventions ADLs/Self Care Home Management;Cryotherapy;Electrical Stimulation;Moist Heat;Iontophoresis 4mg /ml Dexamethasone;Gait training;Stair training;Functional mobility training;Therapeutic activities;Therapeutic exercise;Balance training;Neuromuscular re-education;Manual techniques;Passive range of motion;Patient/family education;Vasopneumatic Device    PT Next Visit Plan Nustep, knee flexion and extension AROM and PROM. gait training, modalities PRN for pain relief    PT Home Exercise Plan continue HEP from home health    Consulted and Agree with Plan of Care Patient           Patient will benefit from skilled therapeutic intervention in order to improve the following deficits and impairments:  Decreased activity tolerance,Decreased balance,Decreased strength,Increased edema,Difficulty walking,Decreased range of motion,Pain  Visit Diagnosis: Acute pain of left knee  Stiffness of left knee, not elsewhere classified  Localized edema  Muscle weakness (generalized)     Problem List There are no problems to display for this patient.   , PT,  DPT 07/03/2020, 5:25 PM  [facility] [address] [place], [place], [place] Phone: [phone]   Fax:  [phone]  Name: TRIG MCBRYAR MRN: Toma Copier Date of Birth: December 24, 1940

## 2020-07-08 ENCOUNTER — Ambulatory Visit: Payer: Medicare HMO | Admitting: Physical Therapy

## 2020-07-10 ENCOUNTER — Ambulatory Visit: Payer: Medicare HMO | Attending: Orthopedic Surgery | Admitting: *Deleted

## 2020-07-10 ENCOUNTER — Other Ambulatory Visit: Payer: Self-pay

## 2020-07-10 DIAGNOSIS — M25662 Stiffness of left knee, not elsewhere classified: Secondary | ICD-10-CM | POA: Diagnosis present

## 2020-07-10 DIAGNOSIS — M6281 Muscle weakness (generalized): Secondary | ICD-10-CM | POA: Diagnosis present

## 2020-07-10 DIAGNOSIS — R600 Localized edema: Secondary | ICD-10-CM | POA: Diagnosis present

## 2020-07-10 DIAGNOSIS — M25562 Pain in left knee: Secondary | ICD-10-CM | POA: Insufficient documentation

## 2020-07-10 NOTE — Therapy (Signed)
[facility] [address] [place], [place], [address] Phone: [phone]   Fax:  [phone]  Physical Therapy Treatment  Patient Details  Name: [name] MRN: [id number] Date of Birth: [birth year] Referring Provider (PT): Gean Birchwood, MD   Encounter Date: 07/10/2020   PT End of Session - 07/10/20 1527    Visit Number 5    Number of Visits 12    Date for PT Re-Evaluation 08/13/20    PT Start Time 1518    PT Stop Time 1609    PT Time Calculation (min) 51 min           No past medical history on file.  Past Surgical History:  Procedure Laterality Date  . 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   Subjective Assessment - 07/10/20 1526    Subjective COVID-19 screening performed upon arrival. Patient reports feeling no pain during the day, but pain keeps him up at night. LT knee stiff painful    Pertinent History L TKA 06/13/2020    Limitations Walking;Standing;House hold activities;Sitting    Patient Stated Goals get around better,    Currently in Pain? Yes    Pain Score 6     Pain Location Knee    Pain Orientation Left    Pain Descriptors / Indicators Sore    Pain Type Surgical pain    Pain Onset 1 to 4 weeks ago                             [facility] Adult PT Treatment/Exercise - 07/10/20 0001      Exercises   Exercises Knee/Hip      Knee/Hip Exercises: Aerobic   Nustep L2 seat 12,10 x17 mins      Knee/Hip Exercises: Standing   Forward Lunges Left;2 sets;10 reps;3 seconds    Forward Lunges Limitations 14" forward knee flexion    Rocker Board 3 minutes      Knee/Hip Exercises: Seated   Long Arc Quad AROM;Left;2 sets;10 reps   3" hold     Modalities   Modalities Vasopneumatic      Vasopneumatic   Number Minutes Vasopneumatic  10 minutes    Vasopnuematic Location  Knee    Vasopneumatic Pressure Low    Vasopneumatic Temperature  34 pain and edema       Manual Therapy   Manual Therapy Passive ROM    Passive ROM PROM to LT knee  for extension and flexion, patella mobs, scar massage, intermittent oscillations to decrease pain in sitting                    PT Short Term Goals - 06/25/20 1305      PT SHORT TERM GOAL #1   Title Patient will be independent with initial HEP    Time 3    Period Weeks    Status New      PT SHORT TERM GOAL #2   Title Patient will demonstrate 8-90+ degrees of left knee AROM to improve mobility during functional tasks    Time 3    Period Weeks    Status New             PT Long Term Goals - 06/25/20 1306      PT LONG TERM GOAL #1   Title Patient will be independent with advanced HEP    Time 6  Period Weeks    Status New      PT LONG TERM GOAL #2   Title Patient will demonstrate 5 degrees or less of left knee extension AROM to improve gait mechanics.    Time 6    Period Weeks    Status New      PT LONG TERM GOAL #3   Title Patient will demonstrate 115+ degrees of left knee flexion AROM to improve ability to perform functional tasks.    Time 6    Period Weeks    Status New      PT LONG TERM GOAL #4   Title Patient will report ability to perform ADLs and home activities with left knee pain less than or equal to 3/10,    Time 6    Period Weeks    Status New      PT LONG TERM GOAL #5   Title Patient will demonstrate reciprocating stair negotiation with no AD and use of one railing to safely enter/exit home.    Time 6    Period Weeks    Status New                 Plan - 07/10/20 1615    Clinical Impression Statement Pt arrived today doing fair, but reports Knee pain is constant  at night and disturbs his sleep. Rx focused on ROM as well as strengthening for LT LE. Manual PROM focused on extension ROM. Normal vaso response.-6 degrees extension today    Personal Factors and Comorbidities Age;Time since onset of injury/illness/exacerbation;Comorbidity 1     Comorbidities L TKA 06/13/2020    Examination-Activity Limitations Bathing;Locomotion Level;Transfers;Sit;Squat;Stairs;Stand;Bend;Dressing;Hygiene/Grooming           Patient will benefit from skilled therapeutic intervention in order to improve the following deficits and impairments:     Visit Diagnosis: Acute pain of left knee  Stiffness of left knee, not elsewhere classified  Localized edema  Muscle weakness (generalized)     Problem List There are no problems to display for this patient.   Berneta Sconyers,CHRIS, PTA 07/10/2020, 4:43 PM  [facility] [address] [place], [place], [address] Phone: [phone]   Fax:  [phone]  Name: [name] MRN: [id number] Date of Birth: 04-05-1941

## 2020-07-15 ENCOUNTER — Ambulatory Visit: Payer: Medicare HMO | Admitting: Physical Therapy

## 2020-07-15 ENCOUNTER — Encounter: Payer: Self-pay | Admitting: Physical Therapy

## 2020-07-15 ENCOUNTER — Other Ambulatory Visit: Payer: Self-pay

## 2020-07-15 DIAGNOSIS — M6281 Muscle weakness (generalized): Secondary | ICD-10-CM

## 2020-07-15 DIAGNOSIS — M25562 Pain in left knee: Secondary | ICD-10-CM

## 2020-07-15 DIAGNOSIS — M25662 Stiffness of left knee, not elsewhere classified: Secondary | ICD-10-CM

## 2020-07-15 DIAGNOSIS — R600 Localized edema: Secondary | ICD-10-CM

## 2020-07-15 NOTE — Therapy (Addendum)
[facility] [address] [place], [place], [address] Phone: [phone]   Fax:  [phone]  Physical Therapy Treatment  Patient Details  Name: [name] MRN: [id number] Date of Birth: 09-01-1940 Referring Provider (PT): Gean Birchwood, MD   Encounter Date: 07/15/2020   PT End of Session - 07/15/20 1656    Visit Number 6    Number of Visits 12    Date for PT Re-Evaluation 08/13/20    PT Start Time 0400    PT Stop Time 0457    PT Time Calculation (min) 57 min    Activity Tolerance Patient tolerated treatment well    Behavior During Therapy [facility] for tasks assessed/performed           History reviewed. No pertinent past medical history.  Past Surgical History:  Procedure Laterality Date  . 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   Subjective Assessment - 07/15/20 1625    Subjective COVID-19 screen performed prior to patient entering clinic.  Knee was hurting real bad last night.    Pertinent History L TKA 06/13/2020    Limitations Walking;Standing;House hold activities;Sitting    Patient Stated Goals get around better,    Currently in Pain? Yes    Pain Score 6     Pain Location Knee    Pain Orientation Left    Pain Descriptors / Indicators Sore    Pain Type Surgical pain                             [facility] Adult PT Treatment/Exercise - 07/15/20 0001      Exercises   Exercises Knee/Hip      Knee/Hip Exercises: Aerobic   Nustep Level 3 x 15 minutes moving seat forward x 2 to increase knee flexion.      Knee/Hip Exercises: Supine   Short Arc Quad Sets Limitations SAQ's facilitated with VMS to patient's left quadriceps with 10 sec extension holds f/b 10 sec rest x 16 minutes.      Modalities   Modalities Estate agent Stimulation Location Right knee.    Electrical Stimulation Action IFC     Electrical Stimulation Parameters 1-10 Hz x 15 minutes.    Electrical Stimulation Goals Pain;Edema      Vasopneumatic   Number Minutes Vasopneumatic  15 minutes    Vasopnuematic Location  --   Left knee.   Vasopneumatic Pressure Low          Left knee.          PT Short Term Goals - 06/25/20 1305      PT SHORT TERM GOAL #1   Title Patient will be independent with initial HEP    Time 3    Period Weeks    Status New      PT SHORT TERM GOAL #2   Title Patient will demonstrate 8-90+ degrees of left knee AROM to improve mobility during functional tasks    Time 3    Period Weeks    Status New             PT Long Term Goals - 06/25/20 1306      PT LONG TERM GOAL #1   Title Patient will be independent with advanced HEP    Time 6    Period Weeks    Status New  PT LONG TERM GOAL #2   Title Patient will demonstrate 5 degrees or less of left knee extension AROM to improve gait mechanics.    Time 6    Period Weeks    Status New      PT LONG TERM GOAL #3   Title Patient will demonstrate 115+ degrees of left knee flexion AROM to improve ability to perform functional tasks.    Time 6    Period Weeks    Status New      PT LONG TERM GOAL #4   Title Patient will report ability to perform ADLs and home activities with left knee pain less than or equal to 3/10,    Time 6    Period Weeks    Status New      PT LONG TERM GOAL #5   Title Patient will demonstrate reciprocating stair negotiation with no AD and use of one railing to safely enter/exit home.    Time 6    Period Weeks    Status New                 Plan - 07/15/20 1652    Clinical Impression Statement Patient did well today with SAQ's facilitated with VMS to left quadriceps.  He also enjoyed IFC with vasopneumatic to decrease pain.    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    Examination-Activity  Limitations Bathing;Locomotion Level;Transfers;Sit;Squat;Stairs;Stand;Bend;Dressing;Hygiene/Grooming    Examination-Participation Restrictions Cleaning;Laundry    Stability/Clinical Decision Making Stable/Uncomplicated    Rehab Potential Good    PT Duration 6 weeks    PT Treatment/Interventions ADLs/Self Care Home Management;Cryotherapy;Electrical Stimulation;Moist Heat;Iontophoresis 4mg /ml Dexamethasone;Gait training;Stair training;Functional mobility training;Therapeutic activities;Therapeutic exercise;Balance training;Neuromuscular re-education;Manual techniques;Passive range of motion;Patient/family education;Vasopneumatic Device    PT Next Visit Plan Nustep, knee flexion and extension AROM and PROM. gait training, modalities PRN for pain relief    Consulted and Agree with Plan of Care Patient           Patient will benefit from skilled therapeutic intervention in order to improve the following deficits and impairments:  Decreased activity tolerance,Decreased balance,Decreased strength,Increased edema,Difficulty walking,Decreased range of motion,Pain  Visit Diagnosis: Acute pain of left knee  Stiffness of left knee, not elsewhere classified  Localized edema  Muscle weakness (generalized)     Problem List There are no problems to display for this patient.   Celestial Barnfield, MPT 07/15/2020, 5:06 PM  [facility] [address] [place], [place], [place] Phone: [phone]   Fax:  [phone]  Name: [name] MRN: Toma Copier Date of Birth: 19-Jun-1940

## 2020-07-17 ENCOUNTER — Encounter: Payer: Self-pay | Admitting: Physical Therapy

## 2020-07-17 ENCOUNTER — Ambulatory Visit: Payer: Medicare HMO | Admitting: Physical Therapy

## 2020-07-17 ENCOUNTER — Other Ambulatory Visit: Payer: Self-pay

## 2020-07-17 DIAGNOSIS — M25562 Pain in left knee: Secondary | ICD-10-CM | POA: Diagnosis not present

## 2020-07-17 DIAGNOSIS — M25662 Stiffness of left knee, not elsewhere classified: Secondary | ICD-10-CM

## 2020-07-17 DIAGNOSIS — R600 Localized edema: Secondary | ICD-10-CM

## 2020-07-17 DIAGNOSIS — M6281 Muscle weakness (generalized): Secondary | ICD-10-CM

## 2020-07-17 NOTE — Therapy (Signed)
[facility] [address] [place], [place], [address] Phone: [phone]   Fax:  [phone]  Physical Therapy Treatment  Patient Details  Name: [name] MRN: [id number] Date of Birth: [birth year] Referring Provider (PT): Gean Birchwood, MD   Encounter Date: 07/17/2020   PT End of Session - 07/17/20 1514    Visit Number 7    Number of Visits 12    Date for PT Re-Evaluation 08/13/20    Authorization Type [employer] (CQ and KX modifier); FOTO; Progress note every 10th visit     PT Start Time 1518    PT Stop Time 1602    PT Time Calculation (min) 44 min    Equipment Utilized During Treatment Other (comment)   SPC   Activity Tolerance Patient tolerated treatment well    Behavior During Therapy [facility] for tasks assessed/performed           History reviewed. No pertinent past medical history.  Past Surgical History:  Procedure Laterality Date  . 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   Subjective Assessment - 07/17/20 1514    Subjective COVID-19 screen performed prior to patient entering clinic. Reports that he hasn't been sleeping well and is trying the [facility] out cause he had to drive himself today. Reports planting vegetable plants in raised beds yesterday as well. Hurts constantly.    Pertinent History L TKA 06/13/2020    Limitations Walking;Standing;House hold activities;Sitting    Patient Stated Goals get around better,    Currently in Pain? Yes    Pain Score 2     Pain Location Knee    Pain Orientation Left    Pain Descriptors / Indicators Sore    Pain Type Surgical pain    Pain Onset 1 to 4 weeks ago    Pain Frequency Constant              [facility] PT Assessment - 07/17/20 0001      Assessment   Medical Diagnosis S/P left total knee replacement    Referring Provider (PT) Gean Birchwood, MD    Onset Date/Surgical Date 06/13/20    Next MD Visit April 2022    Prior  Therapy no      Precautions   Precautions Other (comment)    Precaution Comments no ultrasound      Restrictions   Weight Bearing Restrictions No                         [facility] Adult PT Treatment/Exercise - 07/17/20 0001      Knee/Hip Exercises: Aerobic   Nustep L3, seat 11-8 x15 min for ROM      Knee/Hip Exercises: Standing   Forward Lunges Left;2 sets;10 reps;3 seconds    Forward Lunges Limitations 8" step    Rocker Board 3 minutes      Knee/Hip Exercises: Supine   Short Arc Quad Sets AROM;Left;2 sets;10 reps    Short Arc [employer] Limitations 5 sec holds      Modalities   Modalities Gaffer IFC    Electrical Stimulation Parameters 80-150 hz x10 min    Electrical Stimulation Goals Pain;Edema      Vasopneumatic   Number Minutes Vasopneumatic  10 minutes    Vasopnuematic Location  Knee  Vasopneumatic Pressure Medium    Vasopneumatic Temperature  34/ pain and edema                    PT Short Term Goals - 07/17/20 1609      PT SHORT TERM GOAL #1   Title Patient will be independent with initial HEP    Time 3    Period Weeks    Status On-going      PT SHORT TERM GOAL #2   Title Patient will demonstrate 8-90+ degrees of left knee AROM to improve mobility during functional tasks    Time 3    Period Weeks    Status On-going             PT Long Term Goals - 07/17/20 1610      PT LONG TERM GOAL #1   Title Patient will be independent with advanced HEP    Time 6    Period Weeks    Status On-going      PT LONG TERM GOAL #2   Title Patient will demonstrate 5 degrees or less of left knee extension AROM to improve gait mechanics.    Time 6    Period Weeks    Status On-going      PT LONG TERM GOAL #3   Title Patient will demonstrate 115+ degrees of left knee flexion AROM to improve ability to perform  functional tasks.    Time 6    Period Weeks    Status On-going      PT LONG TERM GOAL #4   Title Patient will report ability to perform ADLs and home activities with left knee pain less than or equal to 3/10,    Time 6    Period Weeks    Status On-going      PT LONG TERM GOAL #5   Title Patient will demonstrate reciprocating stair negotiation with no AD and use of one railing to safely enter/exit home.    Time 6    Period Weeks    Status On-going                 Plan - 07/17/20 1558    Clinical Impression Statement Patient presented in clinic with reports of pain especially after sitting and resting due to stiffness. Patient presented with use of SPC for ambulation. Patient was encouraged to use SPC in RUE but was observed later with SPC in LUE. Patient still using steady objects without reach to support himself as well. Patient able to tolerate light ROM and strengthening with no increased pain reported. Patient able to demonstate some active quad activation without VMS. Normal modalities response noted following removal of the modalities.    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    Examination-Activity Limitations Bathing;Locomotion Level;Transfers;Sit;Squat;Stairs;Stand;Bend;Dressing;Hygiene/Grooming    Examination-Participation Restrictions Cleaning;Laundry    Stability/Clinical Decision Making Stable/Uncomplicated    Rehab Potential Good    PT Frequency 2x / week    PT Duration 6 weeks    PT Treatment/Interventions ADLs/Self Care Home Management;Cryotherapy;Electrical Stimulation;Moist Heat;Iontophoresis 4mg /ml Dexamethasone;Gait training;Stair training;Functional mobility training;Therapeutic activities;Therapeutic exercise;Balance training;Neuromuscular re-education;Manual techniques;Passive range of motion;Patient/family education;Vasopneumatic Device    PT Next Visit Plan  Nustep, knee flexion and extension AROM and PROM. gait training, modalities PRN for pain relief    PT Home Exercise Plan continue HEP from home health    Consulted and Agree with Plan of Care Patient  Patient will benefit from skilled therapeutic intervention in order to improve the following deficits and impairments:  Decreased activity tolerance,Decreased balance,Decreased strength,Increased edema,Difficulty walking,Decreased range of motion,Pain  Visit Diagnosis: Acute pain of left knee  Stiffness of left knee, not elsewhere classified  Localized edema  Muscle weakness (generalized)     Problem List There are no problems to display for this patient.   Marvell Fuller, PTA 07/17/2020, 4:10 PM  [facility] [address] [place], [place], [address] Phone: [phone]   Fax:  [phone]  Name: ALMER BUSHEY MRN: [id number] Date of Birth: May 16, 1940

## 2020-07-21 ENCOUNTER — Other Ambulatory Visit: Payer: Self-pay

## 2020-07-21 ENCOUNTER — Ambulatory Visit: Payer: Medicare HMO | Admitting: Physical Therapy

## 2020-07-21 DIAGNOSIS — M25662 Stiffness of left knee, not elsewhere classified: Secondary | ICD-10-CM

## 2020-07-21 DIAGNOSIS — M25562 Pain in left knee: Secondary | ICD-10-CM

## 2020-07-21 DIAGNOSIS — M6281 Muscle weakness (generalized): Secondary | ICD-10-CM

## 2020-07-21 DIAGNOSIS — R600 Localized edema: Secondary | ICD-10-CM

## 2020-07-21 NOTE — Therapy (Signed)
[facility] [address] [place], [place], [address] Phone: [phone]   Fax:  [phone]  Physical Therapy Treatment  Patient Details  Name: [name] MRN: [id number] Date of Birth: Sep 23, 1940 Referring Provider (PT): Gean Birchwood, MD   Encounter Date: 07/21/2020   PT End of Session - 07/21/20 0846    Visit Number 8    Number of Visits 12    Date for PT Re-Evaluation 08/13/20    PT Start Time 0731    PT Stop Time 0823    PT Time Calculation (min) 52 min    Activity Tolerance Patient tolerated treatment well    Behavior During Therapy [facility] for tasks assessed/performed           No past medical history on file.  Past Surgical History:  Procedure Laterality Date  . 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   Subjective Assessment - 07/21/20 0741    Subjective COVID-19 screen performed prior to patient entering clinic.  No new complaints.    Pertinent History L TKA 06/13/2020    Limitations Walking;Standing;House hold activities;Sitting    Patient Stated Goals get around better,    Currently in Pain? Yes    Pain Score 2     Pain Location Knee    Pain Orientation Left    Pain Descriptors / Indicators Sore    Pain Onset 1 to 4 weeks ago                             [facility] Adult PT Treatment/Exercise - 07/21/20 0001      Exercises   Exercises Knee/Hip      Knee/Hip Exercises: Aerobic   Nustep Level x 3 x 20 minutes.      Modalities   Modalities Primary school teacher Stimulation Location left knee.    Electrical Stimulation Action IFC    Electrical Stimulation Parameters 80-150 Hx x 15 minutes.    Electrical Stimulation Goals Edema;Pain      Vasopneumatic   Number Minutes Vasopneumatic  15 minutes    Vasopnuematic Location  --   Left knee.   Vasopneumatic Pressure Low      Manual Therapy   Manual  Therapy Passive ROM    Passive ROM In supine:  Extension and flexion stretching x 10 minutes.                    PT Short Term Goals - 07/17/20 1609      PT SHORT TERM GOAL #1   Title Patient will be independent with initial HEP    Time 3    Period Weeks    Status On-going      PT SHORT TERM GOAL #2   Title Patient will demonstrate 8-90+ degrees of left knee AROM to improve mobility during functional tasks    Time 3    Period Weeks    Status On-going             PT Long Term Goals - 07/17/20 1610      PT LONG TERM GOAL #1   Title Patient will be independent with advanced HEP    Time 6    Period Weeks    Status On-going      PT LONG TERM GOAL #2   Title Patient will demonstrate 5 degrees  or less of left knee extension AROM to improve gait mechanics.    Time 6    Period Weeks    Status On-going      PT LONG TERM GOAL #3   Title Patient will demonstrate 115+ degrees of left knee flexion AROM to improve ability to perform functional tasks.    Time 6    Period Weeks    Status On-going      PT LONG TERM GOAL #4   Title Patient will report ability to perform ADLs and home activities with left knee pain less than or equal to 3/10,    Time 6    Period Weeks    Status On-going      PT LONG TERM GOAL #5   Title Patient will demonstrate reciprocating stair negotiation with no AD and use of one railing to safely enter/exit home.    Time 6    Period Weeks    Status On-going                 Plan - 07/21/20 0847    Clinical Impression Statement Excellent job today.  Following stretching his knees bilaterally demonstrated equal extension.    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    Examination-Activity Limitations Bathing;Locomotion Level;Transfers;Sit;Squat;Stairs;Stand;Bend;Dressing;Hygiene/Grooming    Examination-Participation Restrictions Cleaning;Laundry     Stability/Clinical Decision Making Stable/Uncomplicated    Rehab Potential Good    PT Frequency 2x / week    PT Duration 6 weeks    PT Treatment/Interventions ADLs/Self Care Home Management;Cryotherapy;Electrical Stimulation;Moist Heat;Iontophoresis 4mg /ml Dexamethasone;Gait training;Stair training;Functional mobility training;Therapeutic activities;Therapeutic exercise;Balance training;Neuromuscular re-education;Manual techniques;Passive range of motion;Patient/family education;Vasopneumatic Device    PT Next Visit Plan Nustep, knee flexion and extension AROM and PROM. gait training, modalities PRN for pain relief    Consulted and Agree with Plan of Care Patient           Patient will benefit from skilled therapeutic intervention in order to improve the following deficits and impairments:  Decreased activity tolerance,Decreased balance,Decreased strength,Increased edema,Difficulty walking,Decreased range of motion,Pain  Visit Diagnosis: Acute pain of left knee  Stiffness of left knee, not elsewhere classified  Localized edema  Muscle weakness (generalized)     Problem List There are no problems to display for this patient.   Sian Rockers, MPT 07/21/2020, 9:09 AM  [facility] [address] [place], [place], [place] Phone: [phone]   Fax:  [phone]  Name: [name] MRN: Toma Copier Date of Birth: 1940/07/29

## 2020-07-24 ENCOUNTER — Ambulatory Visit: Payer: Medicare HMO | Admitting: Physical Therapy

## 2020-07-24 ENCOUNTER — Other Ambulatory Visit: Payer: Self-pay

## 2020-07-24 DIAGNOSIS — M25662 Stiffness of left knee, not elsewhere classified: Secondary | ICD-10-CM

## 2020-07-24 DIAGNOSIS — M25562 Pain in left knee: Secondary | ICD-10-CM

## 2020-07-24 DIAGNOSIS — R600 Localized edema: Secondary | ICD-10-CM

## 2020-07-24 DIAGNOSIS — M6281 Muscle weakness (generalized): Secondary | ICD-10-CM

## 2020-07-24 NOTE — Therapy (Signed)
[facility] [address] [place], [place], [address] Phone: [phone]   Fax:  [phone]  Physical Therapy Treatment  Patient Details  Name: [name] MRN: [id number] Date of Birth: 1940/08/09 Referring Provider (PT): Gean Birchwood, MD   Encounter Date: 07/24/2020   PT End of Session - 07/24/20 0937    Visit Number 9    Number of Visits 12    Date for PT Re-Evaluation 08/13/20    PT Start Time 0900    PT Stop Time 0951    PT Time Calculation (min) 51 min    Activity Tolerance Patient tolerated treatment well    Behavior During Therapy [facility] for tasks assessed/performed           No past medical history on file.  Past Surgical History:  Procedure Laterality Date  . 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   Subjective Assessment - 07/24/20 0938    Subjective COVID-19 screen performed prior to patient entering clinic.  Doing good, walking wiht the cane.    Pertinent History L TKA 06/13/2020    Limitations Walking;Standing;House hold activities;Sitting    Patient Stated Goals get around better,    Currently in Pain? Yes    Pain Score 2     Pain Location Knee    Pain Orientation Left    Pain Descriptors / Indicators Sore    Pain Onset More than a month ago                             [facility] Adult PT Treatment/Exercise - 07/24/20 0001      Exercises   Exercises Knee/Hip      Knee/Hip Exercises: Aerobic   Nustep Level 3 x 15 minutes moving seat forward x 2 to increase knee flexion.      Knee/Hip Exercises: Machines for Strengthening   Cybex Knee Extension 10# x 3 minutes.    Cybex Knee Flexion 30# x 3 minutes.      Programme researcher, broadcasting/film/video Location Left knee.    Electrical Stimulation Action IFC at 80-150 Hz x 15 minutes.    Electrical Stimulation Goals Edema;Pain      Vasopneumatic   Number Minutes Vasopneumatic  15  minutes    Vasopnuematic Location  --   Left knee.   Vasopneumatic Pressure Medium                    PT Short Term Goals - 07/17/20 1609      PT SHORT TERM GOAL #1   Title Patient will be independent with initial HEP    Time 3    Period Weeks    Status On-going      PT SHORT TERM GOAL #2   Title Patient will demonstrate 8-90+ degrees of left knee AROM to improve mobility during functional tasks    Time 3    Period Weeks    Status On-going             PT Long Term Goals - 07/17/20 1610      PT LONG TERM GOAL #1   Title Patient will be independent with advanced HEP    Time 6    Period Weeks    Status On-going      PT LONG TERM GOAL #2   Title Patient will demonstrate 5 degrees or less of left  knee extension AROM to improve gait mechanics.    Time 6    Period Weeks    Status On-going      PT LONG TERM GOAL #3   Title Patient will demonstrate 115+ degrees of left knee flexion AROM to improve ability to perform functional tasks.    Time 6    Period Weeks    Status On-going      PT LONG TERM GOAL #4   Title Patient will report ability to perform ADLs and home activities with left knee pain less than or equal to 3/10,    Time 6    Period Weeks    Status On-going      PT LONG TERM GOAL #5   Title Patient will demonstrate reciprocating stair negotiation with no AD and use of one railing to safely enter/exit home.    Time 6    Period Weeks    Status On-going                 Plan - 07/24/20 0940    Clinical Impression Statement Patient did great today.  He did very well on the weight machines today and he enjoyed the exercise.  He has been walking with a straight cane.    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    Examination-Participation Restrictions Cleaning;Laundry    Stability/Clinical Decision Making Stable/Uncomplicated    Rehab Potential Good     PT Frequency 2x / week    PT Duration 6 weeks    PT Treatment/Interventions ADLs/Self Care Home Management;Cryotherapy;Electrical Stimulation;Moist Heat;Iontophoresis 4mg /ml Dexamethasone;Gait training;Stair training;Functional mobility training;Therapeutic activities;Therapeutic exercise;Balance training;Neuromuscular re-education;Manual techniques;Passive range of motion;Patient/family education;Vasopneumatic Device    PT Next Visit Plan Nustep, knee flexion and extension AROM and PROM. gait training, modalities PRN for pain relief    Consulted and Agree with Plan of Care Patient           Patient will benefit from skilled therapeutic intervention in order to improve the following deficits and impairments:  Decreased activity tolerance,Decreased balance,Decreased strength,Increased edema,Difficulty walking,Decreased range of motion,Pain  Visit Diagnosis: Acute pain of left knee  Stiffness of left knee, not elsewhere classified  Localized edema  Muscle weakness (generalized)     Problem List There are no problems to display for this patient.   Simisola Sandles, MPT 07/24/2020, 9:54 AM  [facility] [address] [place], [place], [place] Phone: [phone]   Fax:  [phone]  Name: [name] MRN: Toma Copier Date of Birth: 28-Mar-1941

## 2020-07-29 ENCOUNTER — Other Ambulatory Visit: Payer: Self-pay

## 2020-07-29 ENCOUNTER — Ambulatory Visit: Payer: Medicare HMO | Admitting: *Deleted

## 2020-07-29 DIAGNOSIS — M25562 Pain in left knee: Secondary | ICD-10-CM | POA: Diagnosis not present

## 2020-07-29 DIAGNOSIS — M25662 Stiffness of left knee, not elsewhere classified: Secondary | ICD-10-CM

## 2020-07-29 DIAGNOSIS — M6281 Muscle weakness (generalized): Secondary | ICD-10-CM

## 2020-07-29 DIAGNOSIS — R600 Localized edema: Secondary | ICD-10-CM

## 2020-07-29 NOTE — Therapy (Signed)
[facility] [address] [place], [place], [address] Phone: [phone]   Fax:  [phone]  Physical Therapy Treatment  Patient Details  Name: [name] MRN: [id number] Date of Birth: Aug 26, 1940 Referring Provider (PT): Gean Birchwood, MD   Encounter Date: 07/29/2020   PT End of Session - 07/29/20 1522    Visit Number 10    Number of Visits 12    Date for PT Re-Evaluation 08/13/20    PT Start Time 1515    PT Stop Time 1613    PT Time Calculation (min) 58 min           No past medical history on file.  Past Surgical History:  Procedure Laterality Date  . 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   Subjective Assessment - 07/29/20 1521    Subjective COVID-19 screen performed prior to patient entering clinic.  Doing good, walking wiht the cane.    Pertinent History L TKA 06/13/2020    Limitations Walking;Standing;House hold activities;Sitting    Patient Stated Goals get around better,    Currently in Pain? Yes    Pain Score 2     Pain Location Knee    Pain Orientation Left    Pain Descriptors / Indicators Sore    Pain Type Surgical pain    Pain Onset More than a month ago                             [facility] Adult PT Treatment/Exercise - 07/29/20 0001      Exercises   Exercises Knee/Hip      Knee/Hip Exercises: Aerobic   Nustep Level 4 x 15 minutes moving seat forward x 2 to increase knee flexion.      Knee/Hip Exercises: Standing   Forward Lunges --   14 in step   Forward Step Up Right;2 sets;10 reps;Step Height: 6"    Step Down Right;2 sets;Step Height: 4"    Rocker Board 3 minutes      Modalities   Modalities Primary school teacher Stimulation Location Left knee.    Electrical Stimulation Action IFC 80-150hz  x 15 mins    Electrical Stimulation Goals Edema;Pain      Vasopneumatic   Number Minutes  Vasopneumatic  15 minutes    Vasopnuematic Location  --   Left knee.   Vasopneumatic Pressure Medium    Vasopneumatic Temperature  34/ pain and edema      Manual Therapy   Manual Therapy Passive ROM    Passive ROM In supine:  Extension and flexion stretching                    PT Short Term Goals - 07/17/20 1609      PT SHORT TERM GOAL #1   Title Patient will be independent with initial HEP    Time 3    Period Weeks    Status On-going      PT SHORT TERM GOAL #2   Title Patient will demonstrate 8-90+ degrees of left knee AROM to improve mobility during functional tasks    Time 3    Period Weeks    Status On-going             PT Long Term Goals - 07/17/20 1610      PT LONG TERM GOAL #1  Title Patient will be independent with advanced HEP    Time 6    Period Weeks    Status On-going      PT LONG TERM GOAL #2   Title Patient will demonstrate 5 degrees or less of left knee extension AROM to improve gait mechanics.    Time 6    Period Weeks    Status On-going      PT LONG TERM GOAL #3   Title Patient will demonstrate 115+ degrees of left knee flexion AROM to improve ability to perform functional tasks.    Time 6    Period Weeks    Status On-going      PT LONG TERM GOAL #4   Title Patient will report ability to perform ADLs and home activities with left knee pain less than or equal to 3/10,    Time 6    Period Weeks    Status On-going      PT LONG TERM GOAL #5   Title Patient will demonstrate reciprocating stair negotiation with no AD and use of one railing to safely enter/exit home.    Time 6    Period Weeks    Status On-going                 Plan - 07/29/20 1524    Clinical Impression Statement Pt arrived today doing fairly well and reports progressing with walking now. Rx focused on ROM and strengthening as well as balance for RT LE. He reports walking around outside has been his biggest challenge.    Personal Factors and Comorbidities  Age;Time since onset of injury/illness/exacerbation;Comorbidity 1    Comorbidities L TKA 06/13/2020    Examination-Activity Limitations Bathing;Locomotion Level;Transfers;Sit;Squat;Stairs;Stand;Bend;Dressing;Hygiene/Grooming    Examination-Participation Restrictions Cleaning;Laundry    Stability/Clinical Decision Making Stable/Uncomplicated    Rehab Potential Good    PT Frequency 2x / week    PT Duration 6 weeks    PT Treatment/Interventions ADLs/Self Care Home Management;Cryotherapy;Electrical Stimulation;Moist Heat;Iontophoresis 4mg /ml Dexamethasone;Gait training;Stair training;Functional mobility training;Therapeutic activities;Therapeutic exercise;Balance training;Neuromuscular re-education;Manual techniques;Passive range of motion;Patient/family education;Vasopneumatic Device    PT Next Visit Plan Nustep, knee flexion and extension AROM and PROM. gait training, modalities PRN for pain relief           Patient will benefit from skilled therapeutic intervention in order to improve the following deficits and impairments:  Decreased activity tolerance,Decreased balance,Decreased strength,Increased edema,Difficulty walking,Decreased range of motion,Pain  Visit Diagnosis: Acute pain of left knee  Stiffness of left knee, not elsewhere classified  Localized edema  Muscle weakness (generalized)     Problem List There are no problems to display for this patient.   Dicie Edelen,CHRIS, PTA 07/29/2020, 6:05 PM  [facility] [address] [place], [place], [place] Phone: [phone]   Fax:  [phone]  Name: [name] MRN: Toma Copier Date of Birth: 07-18-1940

## 2020-07-31 ENCOUNTER — Ambulatory Visit: Payer: Medicare HMO | Admitting: Physical Therapy

## 2020-07-31 ENCOUNTER — Other Ambulatory Visit: Payer: Self-pay

## 2020-07-31 DIAGNOSIS — R600 Localized edema: Secondary | ICD-10-CM

## 2020-07-31 DIAGNOSIS — M25562 Pain in left knee: Secondary | ICD-10-CM

## 2020-07-31 DIAGNOSIS — M25662 Stiffness of left knee, not elsewhere classified: Secondary | ICD-10-CM

## 2020-07-31 DIAGNOSIS — M6281 Muscle weakness (generalized): Secondary | ICD-10-CM

## 2020-07-31 NOTE — Therapy (Signed)
[facility] [address] [place], [place], [address] Phone: [phone]   Fax:  [phone]  Physical Therapy Treatment  Patient Details  Name: TITAN KARNER MRN: [id number] Date of Birth: 1940/07/09 Referring Provider (PT): Gean Birchwood, MD   Encounter Date: 07/31/2020   PT End of Session - 07/31/20 1714    Visit Number 11    Number of Visits 12    Date for PT Re-Evaluation 08/13/20    PT Start Time 0315    PT Stop Time 0407    PT Time Calculation (min) 52 min    Activity Tolerance Patient tolerated treatment well    Behavior During Therapy [facility] for tasks assessed/performed           No past medical history on file.  Past Surgical History:  Procedure Laterality Date  . 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   Subjective Assessment - 07/31/20 1707    Subjective COVID-19 screen performed prior to patient entering clinic.  Left big toe hurting.    Pertinent History L TKA 06/13/2020    Limitations Walking;Standing;House hold activities;Sitting    Patient Stated Goals get around better,    Currently in Pain? Yes    Pain Location Knee    Pain Orientation Left    Pain Descriptors / Indicators Sore    Pain Type Surgical pain    Pain Onset More than a month ago                             [facility] Adult PT Treatment/Exercise - 07/31/20 0001      Exercises   Exercises Knee/Hip      Knee/Hip Exercises: Aerobic   Nustep Level 4 x 15 minutes moving seat forward x 2 to increase knee flexion.      Knee/Hip Exercises: Machines for Strengthening   Cybex Knee Extension 10# x 4 minutes.    Cybex Knee Flexion 40# x 4 minutes.      Vasopneumatic   Number Minutes Vasopneumatic  15 minutes    Vasopnuematic Location  --   Left knee.   Vasopneumatic Pressure Low                    PT Short Term Goals - 07/17/20 1609      PT SHORT TERM GOAL #1   Title  Patient will be independent with initial HEP    Time 3    Period Weeks    Status On-going      PT SHORT TERM GOAL #2   Title Patient will demonstrate 8-90+ degrees of left knee AROM to improve mobility during functional tasks    Time 3    Period Weeks    Status On-going             PT Long Term Goals - 07/17/20 1610      PT LONG TERM GOAL #1   Title Patient will be independent with advanced HEP    Time 6    Period Weeks    Status On-going      PT LONG TERM GOAL #2   Title Patient will demonstrate 5 degrees or less of left knee extension AROM to improve gait mechanics.    Time 6    Period Weeks    Status On-going      PT LONG TERM GOAL #3   Title Patient  will demonstrate 115+ degrees of left knee flexion AROM to improve ability to perform functional tasks.    Time 6    Period Weeks    Status On-going      PT LONG TERM GOAL #4   Title Patient will report ability to perform ADLs and home activities with left knee pain less than or equal to 3/10,    Time 6    Period Weeks    Status On-going      PT LONG TERM GOAL #5   Title Patient will demonstrate reciprocating stair negotiation with no AD and use of one railing to safely enter/exit home.    Time 6    Period Weeks    Status On-going                 Plan - 07/31/20 1709    Clinical Impression Statement Patient is making excellent progress. He seated performed hamstring curls at 40# without complaint. He c/o left great toe pain today.  He had no left calf tenderness or warmth or redness.  His left great toe did not appear swollen either.  I recommeded he call his doctor which he said he would.    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    Examination-Activity Limitations Bathing;Locomotion Level;Transfers;Sit;Squat;Stairs;Stand;Bend;Dressing;Hygiene/Grooming    Examination-Participation Restrictions Cleaning;Laundry     Stability/Clinical Decision Making Stable/Uncomplicated    Rehab Potential Good    PT Frequency 2x / week    PT Duration 6 weeks    PT Treatment/Interventions ADLs/Self Care Home Management;Cryotherapy;Electrical Stimulation;Moist Heat;Iontophoresis 4mg /ml Dexamethasone;Gait training;Stair training;Functional mobility training;Therapeutic activities;Therapeutic exercise;Balance training;Neuromuscular re-education;Manual techniques;Passive range of motion;Patient/family education;Vasopneumatic Device    PT Next Visit Plan Recumbent bike.    Consulted and Agree with Plan of Care Patient           Patient will benefit from skilled therapeutic intervention in order to improve the following deficits and impairments:  Decreased activity tolerance,Decreased balance,Decreased strength,Increased edema,Difficulty walking,Decreased range of motion,Pain  Visit Diagnosis: Acute pain of left knee  Stiffness of left knee, not elsewhere classified  Localized edema  Muscle weakness (generalized)     Problem List There are no problems to display for this patient.   Zayla Agar, MPT 07/31/2020, 5:15 PM  [facility] [address] [place], [place], [place] Phone: [phone]   Fax:  [phone]  Name: HERB BELTRE MRN: Toma Copier Date of Birth: 02/10/1941

## 2020-08-05 ENCOUNTER — Ambulatory Visit: Payer: Medicare HMO | Attending: Orthopedic Surgery | Admitting: Physical Therapy

## 2020-08-05 ENCOUNTER — Other Ambulatory Visit: Payer: Self-pay

## 2020-08-05 ENCOUNTER — Encounter: Payer: Self-pay | Admitting: Physical Therapy

## 2020-08-05 DIAGNOSIS — R600 Localized edema: Secondary | ICD-10-CM

## 2020-08-05 DIAGNOSIS — M6281 Muscle weakness (generalized): Secondary | ICD-10-CM

## 2020-08-05 DIAGNOSIS — M25662 Stiffness of left knee, not elsewhere classified: Secondary | ICD-10-CM | POA: Diagnosis present

## 2020-08-05 DIAGNOSIS — M25562 Pain in left knee: Secondary | ICD-10-CM | POA: Diagnosis not present

## 2020-08-05 NOTE — Therapy (Signed)
[facility] [address] [place], [place], [address] Phone: [phone]   Fax:  [phone]  Physical Therapy Treatment  Patient Details  Name: [name] MRN: [id number] Date of Birth: Feb 05, 1941 Referring Provider (PT): Gean Birchwood, MD   Encounter Date: 08/05/2020   PT End of Session - 08/05/20 1528    Visit Number 12    Number of Visits 16    Date for PT Re-Evaluation 08/27/20    PT Start Time 0300    PT Stop Time 0345    PT Time Calculation (min) 45 min    Activity Tolerance Patient tolerated treatment well    Behavior During Therapy [facility] for tasks assessed/performed           History reviewed. No pertinent past medical history.  Past Surgical History:  Procedure Laterality Date  . 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   Subjective Assessment - 08/05/20 1536    Subjective COVID-19 screen performed prior to patient entering clinic.  Went to see the doctor about my toe and they told me to soak it in Epson salt.  It helped a lot.  Been using TENS unit on knee.    Pertinent History L TKA 06/13/2020    Limitations Walking;Standing;House hold activities;Sitting    Currently in Pain? Yes    Pain Score 2     Pain Location Knee    Pain Orientation Left    Pain Descriptors / Indicators Sore    Pain Type Surgical pain    Pain Onset More than a month ago                             [facility] Adult PT Treatment/Exercise - 08/05/20 0001      Exercises   Exercises Knee/Hip      Knee/Hip Exercises: Aerobic   Nustep Level 4 x 15 minutes.      Knee/Hip Exercises: Machines for Strengthening   Cybex Knee Extension 10# x 4 minutes.    Cybex Knee Flexion 40# x 4 minutes.      Vasopneumatic   Number Minutes Vasopneumatic  15 minutes    Vasopnuematic Location  --   Left knee.   Vasopneumatic Pressure Low                    PT Short Term Goals -  07/17/20 1609      PT SHORT TERM GOAL #1   Title Patient will be independent with initial HEP    Time 3    Period Weeks    Status On-going      PT SHORT TERM GOAL #2   Title Patient will demonstrate 8-90+ degrees of left knee AROM to improve mobility during functional tasks    Time 3    Period Weeks    Status On-going             PT Long Term Goals - 07/17/20 1610      PT LONG TERM GOAL #1   Title Patient will be independent with advanced HEP    Time 6    Period Weeks    Status On-going      PT LONG TERM GOAL #2   Title Patient will demonstrate 5 degrees or less of left knee extension AROM to improve gait mechanics.    Time 6    Period Weeks  Status On-going      PT LONG TERM GOAL #3   Title Patient will demonstrate 115+ degrees of left knee flexion AROM to improve ability to perform functional tasks.    Time 6    Period Weeks    Status On-going      PT LONG TERM GOAL #4   Title Patient will report ability to perform ADLs and home activities with left knee pain less than or equal to 3/10,    Time 6    Period Weeks    Status On-going      PT LONG TERM GOAL #5   Title Patient will demonstrate reciprocating stair negotiation with no AD and use of one railing to safely enter/exit home.    Time 6    Period Weeks    Status On-going                 Plan - 08/05/20 1548    Clinical Impression Statement The patient did very well today.  He also states that his left toe is much better since soaking in Epson salt.    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    Examination-Activity Limitations Bathing;Locomotion Level;Transfers;Sit;Squat;Stairs;Stand;Bend;Dressing;Hygiene/Grooming    Examination-Participation Restrictions Cleaning;Laundry    Stability/Clinical Decision Making Stable/Uncomplicated    Rehab Potential Good    PT Frequency 2x / week    PT Duration 6  weeks    PT Treatment/Interventions ADLs/Self Care Home Management;Cryotherapy;Electrical Stimulation;Moist Heat;Iontophoresis 4mg /ml Dexamethasone;Gait training;Stair training;Functional mobility training;Therapeutic activities;Therapeutic exercise;Balance training;Neuromuscular re-education;Manual techniques;Passive range of motion;Patient/family education;Vasopneumatic Device           Patient will benefit from skilled therapeutic intervention in order to improve the following deficits and impairments:  Decreased activity tolerance,Decreased balance,Decreased strength,Increased edema,Difficulty walking,Decreased range of motion,Pain  Visit Diagnosis: Acute pain of left knee - Plan: PT plan of care cert/re-cert  Stiffness of left knee, not elsewhere classified - Plan: PT plan of care cert/re-cert  Muscle weakness (generalized) - Plan: PT plan of care cert/re-cert  Localized edema - Plan: PT plan of care cert/re-cert     Problem List There are no problems to display for this patient.   Josuel Koeppen, MPT 08/05/2020, 3:57 PM  [facility] [address] [place], [place], [place] Phone: [phone]   Fax:  [phone]  Name: DARAN FAVARO MRN: Toma Copier Date of Birth: Dec 16, 1940

## 2020-08-07 ENCOUNTER — Other Ambulatory Visit: Payer: Self-pay

## 2020-08-07 ENCOUNTER — Ambulatory Visit: Payer: Medicare HMO | Admitting: Physical Therapy

## 2020-08-07 DIAGNOSIS — M25562 Pain in left knee: Secondary | ICD-10-CM | POA: Diagnosis not present

## 2020-08-07 DIAGNOSIS — R600 Localized edema: Secondary | ICD-10-CM

## 2020-08-07 DIAGNOSIS — M6281 Muscle weakness (generalized): Secondary | ICD-10-CM

## 2020-08-07 DIAGNOSIS — M25662 Stiffness of left knee, not elsewhere classified: Secondary | ICD-10-CM

## 2020-08-07 NOTE — Therapy (Signed)
[facility] [place], [place], [address] Phone: [phone]   Fax:  [phone]  Physical Therapy Treatment  Patient Details  Name: [name] MRN: [id number] Date of Birth: 1940-11-13 Referring Provider (PT): Frederik Pear, MD   Encounter Date: 08/07/2020   PT End of Session - 08/07/20 1519    Visit Number 13    Number of Visits 16    Date for PT Re-Evaluation 08/27/20    Authorization Type [employer] (CQ and KX modifier); FOTO 13th visit 67 score; Progress note every 10th visit    PT Start Time 0314    PT Stop Time 0352    PT Time Calculation (min) 38 min    Activity Tolerance Patient tolerated treatment well    Behavior During Therapy [facility] for tasks assessed/performed           No past medical history on file.  Past Surgical History:  Procedure Laterality Date  . 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   Subjective Assessment - 08/07/20 1518    Subjective COVID-19 screen performed prior to patient entering clinic.  Patient arrived doing well.    Pertinent History L TKA 06/13/2020    Limitations Walking;Standing;House hold activities;Sitting    Patient Stated Goals get around better,    Currently in Pain? Yes    Pain Score 2     Pain Location Knee    Pain Orientation Left    Pain Descriptors / Indicators Sore    Pain Type Surgical pain    Pain Onset More than a month ago    Pain Frequency Intermittent    Aggravating Factors  certain activity    Pain Relieving Factors rest              [facility] PT Assessment - 08/07/20 0001      AROM   AROM Assessment Site Knee    Right/Left Knee Left    Left Knee Extension -5    Left Knee Flexion 110      PROM   PROM Assessment Site Knee    Right/Left Knee Left    Left Knee Extension -2    Left Knee Flexion 116                         [facility] Adult PT Treatment/Exercise - 08/07/20 0001       Knee/Hip Exercises: Aerobic   Recumbent Bike x57mn   did not want to continue on bike   Nustep L4 x 679m      Vasopneumatic   Number Minutes Vasopneumatic  10 minutes    Vasopnuematic Location  Knee    Vasopneumatic Pressure Low    Vasopneumatic Temperature  34/ pain and edema      Manual Therapy   Manual Therapy Passive ROM    Passive ROM manual PROM for flexion/ext with low load holds to improve mobility                    PT Short Term Goals - 08/07/20 1519      PT SHORT TERM GOAL #1   Title Patient will be independent with initial HEP    Baseline met 08/07/20    Time 3    Period Weeks    Status Achieved      PT SHORT TERM GOAL #2   Title Patient will demonstrate 8-90+ degrees of left knee  AROM to improve mobility during functional tasks    Baseline AROM-5-110 08/07/20    Time 3    Period Weeks    Status Achieved             PT Long Term Goals - 08/07/20 1519      PT LONG TERM GOAL #1   Title Patient will be independent with advanced HEP    Time 6    Period Weeks    Status On-going      PT LONG TERM GOAL #2   Title Patient will demonstrate 5 degrees or less of left knee extension AROM to improve gait mechanics.    Baseline AROM -5 degrees 08/07/20    Time 6    Period Weeks    Status Achieved      PT LONG TERM GOAL #3   Title Patient will demonstrate 115+ degrees of left knee flexion AROM to improve ability to perform functional tasks.    Baseline AROM 110 degrees 08/07/20    Time 6    Period Weeks    Status On-going      PT LONG TERM GOAL #4   Title Patient will report ability to perform ADLs and home activities with left knee pain less than or equal to 3/10,    Baseline 2-3/10 pain with ADL's 08/07/20    Time 6    Period Weeks    Status Achieved      PT LONG TERM GOAL #5   Title Patient will demonstrate reciprocating stair negotiation with no AD and use of one railing to safely enter/exit home.    Baseline met 08/07/20    Time 6    Period Weeks     Status Achieved                 Plan - 08/07/20 1542    Clinical Impression Statement Patient tolerated treatment well today. Patient reported little overall pain and able to complete ADL's with greater ease and 2-3/10 pain. Patient is walking independent and improved ROM for left knee flexion and ext. Patient met all STG's and all LTG's except AROM for flexion and HEP. Patient is independent with initail stretches and activities. Patient will need a HEP progression next week. Normal response to modality today.    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    Examination-Activity Limitations Bathing;Locomotion Level;Transfers;Sit;Squat;Stairs;Stand;Bend;Dressing;Hygiene/Grooming    Examination-Participation Restrictions Cleaning;Laundry    Stability/Clinical Decision Making Stable/Uncomplicated    Rehab Potential Good    PT Frequency 2x / week    PT Duration 6 weeks    PT Treatment/Interventions ADLs/Self Care Home Management;Cryotherapy;Electrical Stimulation;Moist Heat;Iontophoresis 69m/ml Dexamethasone;Gait training;Stair training;Functional mobility training;Therapeutic activities;Therapeutic exercise;Balance training;Neuromuscular re-education;Manual techniques;Passive range of motion;Patient/family education;Vasopneumatic Device    PT Next Visit Plan cont with POC for PRE's and flexion stretching/ establish advanced HEP and DC    Consulted and Agree with Plan of Care Patient           Patient will benefit from skilled therapeutic intervention in order to improve the following deficits and impairments:  Decreased activity tolerance,Decreased balance,Decreased strength,Increased edema,Difficulty walking,Decreased range of motion,Pain  Visit Diagnosis: Acute pain of left knee  Stiffness of left knee, not elsewhere classified  Muscle weakness (generalized)  Localized  edema     Problem List There are no problems to display for this patient.   Koree Staheli P, PTA 08/07/2020, 3:53 PM  [facility] [place], [place], [address] Phone: [phone]   Fax:  [phone]  Name: BERTRUM HELMSTETTER MRN: [id number] Date of Birth: Jan 23, 1941

## 2020-08-12 ENCOUNTER — Other Ambulatory Visit: Payer: Self-pay

## 2020-08-12 ENCOUNTER — Ambulatory Visit: Payer: Medicare HMO | Admitting: Physical Therapy

## 2020-08-12 DIAGNOSIS — M6281 Muscle weakness (generalized): Secondary | ICD-10-CM

## 2020-08-12 DIAGNOSIS — M25562 Pain in left knee: Secondary | ICD-10-CM

## 2020-08-12 DIAGNOSIS — M25662 Stiffness of left knee, not elsewhere classified: Secondary | ICD-10-CM

## 2020-08-12 DIAGNOSIS — R600 Localized edema: Secondary | ICD-10-CM

## 2020-08-12 NOTE — Therapy (Signed)
[facility] [place], [place], [address] Phone: [phone]   Fax:  [phone]  Physical Therapy Treatment  Patient Details  Name: ROSHAUN POUND MRN: [id number] Date of Birth: 09-28-1940 Referring Provider (PT): Frederik Pear, MD   Encounter Date: 08/12/2020   PT End of Session - 08/12/20 1603    Visit Number 14    Number of Visits 16    Date for PT Re-Evaluation 08/27/20    Authorization Type [employer] (CQ and KX modifier); FOTO 13th visit 67 score; Progress note every 10th visit    PT Start Time 0230    PT Stop Time 0311    PT Time Calculation (min) 41 min    Activity Tolerance Patient tolerated treatment well    Behavior During Therapy [facility] for tasks assessed/performed           No past medical history on file.  Past Surgical History:  Procedure Laterality Date  . 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   Subjective Assessment - 08/12/20 1602    Subjective COVID-19 screen performed prior to patient entering clinic.  Doing good.    Pertinent History L TKA 06/13/2020    Limitations Walking;Standing;House hold activities;Sitting    Patient Stated Goals get around better,    Currently in Pain? Yes    Pain Score 2     Pain Location Knee    Pain Orientation Left    Pain Descriptors / Indicators Sore    Pain Type Surgical pain    Pain Onset More than a month ago                             [facility] Adult PT Treatment/Exercise - 08/12/20 0001      Exercises   Exercises Knee/Hip      Knee/Hip Exercises: Aerobic   Nustep 16 minutes at level 4.      Knee/Hip Exercises: Machines for Strengthening   Cybex Knee Extension 10# x 4 minutes.    Cybex Knee Flexion 30# x 3 minutes.      Vasopneumatic   Number Minutes Vasopneumatic  15 minutes    Vasopnuematic Location  --   Left knee.   Vasopneumatic Pressure Low                     PT Short Term Goals - 08/07/20 1519      PT SHORT TERM GOAL #1   Title Patient will be independent with initial HEP    Baseline met 08/07/20    Time 3    Period Weeks    Status Achieved      PT SHORT TERM GOAL #2   Title Patient will demonstrate 8-90+ degrees of left knee AROM to improve mobility during functional tasks    Baseline AROM-5-110 08/07/20    Time 3    Period Weeks    Status Achieved             PT Long Term Goals - 08/12/20 1610      PT LONG TERM GOAL #1   Title Patient will be independent with advanced HEP    Period Weeks    Status Achieved                 Plan - 08/12/20 1611    Clinical Impression Statement The patient has made very goor progress toward  goals.  He has two visits remaining and is expected to meet all goals.    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    Examination-Activity Limitations Bathing;Locomotion Level;Transfers;Sit;Squat;Stairs;Stand;Bend;Dressing;Hygiene/Grooming    Examination-Participation Restrictions Cleaning;Laundry    Stability/Clinical Decision Making Stable/Uncomplicated    Rehab Potential Good    PT Frequency 2x / week    PT Duration 6 weeks    PT Treatment/Interventions ADLs/Self Care Home Management;Cryotherapy;Electrical Stimulation;Moist Heat;Iontophoresis 61m/ml Dexamethasone;Gait training;Stair training;Functional mobility training;Therapeutic activities;Therapeutic exercise;Balance training;Neuromuscular re-education;Manual techniques;Passive range of motion;Patient/family education;Vasopneumatic Device    Consulted and Agree with Plan of Care Patient           Patient will benefit from skilled therapeutic intervention in order to improve the following deficits and impairments:  Decreased activity tolerance,Decreased balance,Decreased strength,Increased edema,Difficulty walking,Decreased range of motion,Pain  Visit  Diagnosis: Acute pain of left knee  Stiffness of left knee, not elsewhere classified  Muscle weakness (generalized)  Localized edema     Problem List There are no problems to display for this patient.   Baylea Milburn, CMaliMPT 08/12/2020, 4:13 PM  CSt[facility] Center[address]MM[address] [address]Phone: [phone]  Fax:  [phone] Name: BHOYLE BARKDULLMRN: [id number] of Birth: 3October 08, 1942

## 2020-08-14 ENCOUNTER — Ambulatory Visit: Payer: Medicare HMO | Admitting: Physical Therapy

## 2020-08-14 ENCOUNTER — Other Ambulatory Visit: Payer: Self-pay

## 2020-08-14 DIAGNOSIS — M25662 Stiffness of left knee, not elsewhere classified: Secondary | ICD-10-CM

## 2020-08-14 DIAGNOSIS — R600 Localized edema: Secondary | ICD-10-CM

## 2020-08-14 DIAGNOSIS — M25562 Pain in left knee: Secondary | ICD-10-CM | POA: Diagnosis not present

## 2020-08-14 DIAGNOSIS — M6281 Muscle weakness (generalized): Secondary | ICD-10-CM

## 2020-08-14 NOTE — Therapy (Signed)
[facility] [place], [place], [address] Phone: [phone]   Fax:  [phone]  Physical Therapy Treatment  Patient Details  Name: [name] MRN: [id number] Date of Birth: [birth year] Referring Provider (PT): Frederik Pear, MD   Encounter Date: 08/14/2020   PT End of Session - 08/14/20 1440    Visit Number 15    Number of Visits 16    Date for PT Re-Evaluation 08/27/20    Authorization Type [employer] (CQ and KX modifier); FOTO 13th visit 67 score; Progress note every 10th visit    PT Start Time 0229    PT Stop Time 0312    PT Time Calculation (min) 43 min    Activity Tolerance Patient tolerated treatment well    Behavior During Therapy [facility] for tasks assessed/performed           No past medical history on file.  Past Surgical History:  Procedure Laterality Date  . 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   Subjective Assessment - 08/14/20 1439    Subjective COVID-19 screen performed prior to patient entering clinic.  Patient arrivd doing well.    Pertinent History L TKA 06/13/2020    Limitations Walking;Standing;House hold activities;Sitting    Patient Stated Goals get around better,    Currently in Pain? Yes    Pain Score 2     Pain Location Knee    Pain Orientation Left    Pain Descriptors / Indicators Sore    Pain Type Surgical pain    Pain Onset More than a month ago    Pain Frequency Intermittent    Aggravating Factors  certain activity    Pain Relieving Factors rest              [facility] PT Assessment - 08/14/20 0001      AROM   AROM Assessment Site Knee    Right/Left Knee Left    Left Knee Flexion 115      PROM   PROM Assessment Site Knee    Right/Left Knee Left    Left Knee Flexion 120                         [facility] Adult PT Treatment/Exercise - 08/14/20 0001      Knee/Hip Exercises: Aerobic   Nustep x16 min UE/LE  for ROM      Knee/Hip Exercises: Machines for Strengthening   Cybex Knee Extension 20# 2x fatigue    Cybex Knee Flexion 40# 2x fatigue      Vasopneumatic   Number Minutes Vasopneumatic  10 minutes    Vasopnuematic Location  Knee    Vasopneumatic Pressure Low      Manual Therapy   Manual Therapy Passive ROM    Passive ROM manual PROM for flexion/ext with low load holds to improve mobility                    PT Short Term Goals - 08/14/20 1449      PT SHORT TERM GOAL #1   Title Patient will be independent with initial HEP    Baseline met 08/07/20    Time 3    Period Weeks    Status Achieved      PT SHORT TERM GOAL #2   Title Patient will demonstrate 8-90+ degrees of left knee AROM to improve mobility during functional tasks  Baseline AROM-5-110 08/07/20    Time 3    Period Weeks    Status Achieved             PT Long Term Goals - 08/14/20 1449      PT LONG TERM GOAL #1   Title Patient will be independent with advanced HEP    Time 6    Period Weeks    Status Achieved      PT LONG TERM GOAL #2   Title Patient will demonstrate 5 degrees or less of left knee extension AROM to improve gait mechanics.    Baseline AROM -5 degrees 08/07/20    Time 6    Period Weeks    Status Achieved      PT LONG TERM GOAL #3   Title Patient will demonstrate 115+ degrees of left knee flexion AROM to improve ability to perform functional tasks.    Baseline AROM 115 degrees 08/14/20    Time 6    Period Weeks    Status Achieved      PT LONG TERM GOAL #4   Title Patient will report ability to perform ADLs and home activities with left knee pain less than or equal to 3/10,    Baseline 2-3/10 pain with ADL's 08/07/20    Time 6    Period Weeks    Status Achieved                 Plan - 08/14/20 1459    Clinical Impression Statement Patient met all goals today. Patient has mininal discomfort and improved with all activities. Patient able to perorm ADL's with greater ease.  Patient ROM WNL and doing well overall. MD F/U next week.    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    Examination-Activity Limitations Bathing;Locomotion Level;Transfers;Sit;Squat;Stairs;Stand;Bend;Dressing;Hygiene/Grooming    Examination-Participation Restrictions Cleaning;Laundry    Stability/Clinical Decision Making Stable/Uncomplicated    Rehab Potential Good    PT Frequency 2x / week    PT Duration 6 weeks    PT Treatment/Interventions ADLs/Self Care Home Management;Cryotherapy;Electrical Stimulation;Moist Heat;Iontophoresis 70m/ml Dexamethasone;Gait training;Stair training;Functional mobility training;Therapeutic activities;Therapeutic exercise;Balance training;Neuromuscular re-education;Manual techniques;Passive range of motion;Patient/family education;Vasopneumatic Device    PT Next Visit Plan MD note/review and DC/FOTO    Consulted and Agree with Plan of Care Patient           Patient will benefit from skilled therapeutic intervention in order to improve the following deficits and impairments:  Decreased activity tolerance,Decreased balance,Decreased strength,Increased edema,Difficulty walking,Decreased range of motion,Pain  Visit Diagnosis: Acute pain of left knee  Stiffness of left knee, not elsewhere classified  Muscle weakness (generalized)  Localized edema     Problem List There are no problems to display for this patient.   DPhillips Climes PTA 08/14/2020, 3:13 PM  [facility] [address]Phone: [phone]  Fax:  [phone] Name: Melvin MARLETTEMRN: [id number] of Birth: 3Mar 06, 1942

## 2020-08-18 ENCOUNTER — Other Ambulatory Visit: Payer: Self-pay

## 2020-08-18 ENCOUNTER — Ambulatory Visit: Payer: Medicare HMO | Admitting: Physical Therapy

## 2020-08-18 DIAGNOSIS — M25662 Stiffness of left knee, not elsewhere classified: Secondary | ICD-10-CM

## 2020-08-18 DIAGNOSIS — M6281 Muscle weakness (generalized): Secondary | ICD-10-CM

## 2020-08-18 DIAGNOSIS — M25562 Pain in left knee: Secondary | ICD-10-CM

## 2020-08-18 DIAGNOSIS — R600 Localized edema: Secondary | ICD-10-CM

## 2020-08-18 NOTE — Therapy (Signed)
[facility] [place], [place], [address] Phone: [phone]   Fax:  [phone]  Physical Therapy Treatment  Patient Details  Name: [name] MRN: [id number] Date of Birth: 03-06-1941 Referring Provider (PT): Frederik Pear, MD   Encounter Date: 08/18/2020   PT End of Session - 08/18/20 1535    Visit Number 16    Number of Visits 16    Date for PT Re-Evaluation 08/27/20    Authorization Type [employer] (CQ and KX modifier); FOTO 13th visit 67 score; Progress note every 10th visit    PT Start Time 0315    PT Stop Time 0359    PT Time Calculation (min) 44 min    Activity Tolerance Patient tolerated treatment well    Behavior During Therapy [facility] for tasks assessed/performed           No past medical history on file.  Past Surgical History:  Procedure Laterality Date  . 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                      [facility] Adult PT Treatment/Exercise - 08/18/20 0001      Exercises   Exercises Knee/Hip      Knee/Hip Exercises: Aerobic   Nustep Level 3 x 17 minutes.      Knee/Hip Exercises: Machines for Strengthening   Cybex Knee Extension 10# x 3 minutes    Cybex Knee Flexion 40# x 3 minutes.      Vasopneumatic   Number Minutes Vasopneumatic  15 minutes    Vasopnuematic Location  --   Left knee.                   PT Short Term Goals - 08/14/20 1449      PT SHORT TERM GOAL #1   Title Patient will be independent with initial HEP    Baseline met 08/07/20    Time 3    Period Weeks    Status Achieved      PT SHORT TERM GOAL #2   Title Patient will demonstrate 8-90+ degrees of left knee AROM to improve mobility during functional tasks    Baseline AROM-5-110 08/07/20    Time 3    Period Weeks    Status Achieved             PT Long Term Goals - 08/18/20 1531      PT LONG TERM GOAL #1   Title Patient will be  independent with advanced HEP    Time 6    Status Achieved      PT LONG TERM GOAL #2   Title Patient will demonstrate 5 degrees or less of left knee extension AROM to improve gait mechanics.    Baseline AROM -5 degrees 08/07/20    Time 6    Period Weeks    Status Achieved      PT LONG TERM GOAL #3   Title Patient will demonstrate 115+ degrees of left knee flexion AROM to improve ability to perform functional tasks.    Baseline AROM 115 degrees 08/14/20    Time 6    Period Weeks    Status Achieved      PT LONG TERM GOAL #4   Title Patient will report ability to perform ADLs and home activities with left knee pain less than or equal to 3/10,    Baseline 2-3/10 pain  with ADL's 08/07/20    Time 6    Period Weeks    Status Achieved      PT LONG TERM GOAL #5   Title Patient will demonstrate reciprocating stair negotiation with no AD and use of one railing to safely enter/exit home.    Baseline met 08/07/20    Time 6    Period Weeks    Status Achieved                 Plan - 08/18/20 1538    Clinical Impression Statement See "therapy Note" section.    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    Examination-Activity Limitations Bathing;Locomotion Level;Transfers;Sit;Squat;Stairs;Stand;Bend;Dressing;Hygiene/Grooming    Examination-Participation Restrictions Cleaning;Laundry    Stability/Clinical Decision Making Stable/Uncomplicated    Rehab Potential Good    PT Frequency 2x / week    PT Duration 6 weeks    PT Treatment/Interventions ADLs/Self Care Home Management;Cryotherapy;Electrical Stimulation;Moist Heat;Iontophoresis 19m/ml Dexamethasone;Gait training;Stair training;Functional mobility training;Therapeutic activities;Therapeutic exercise;Balance training;Neuromuscular re-education;Manual techniques;Passive range of motion;Patient/family education;Vasopneumatic Device    Consulted and  Agree with Plan of Care Patient           Patient will benefit from skilled therapeutic intervention in order to improve the following deficits and impairments:  Decreased activity tolerance,Decreased balance,Decreased strength,Increased edema,Difficulty walking,Decreased range of motion,Pain  Visit Diagnosis: Acute pain of left knee  Stiffness of left knee, not elsewhere classified  Muscle weakness (generalized)  Localized edema     Problem List There are no problems to display for this patient.  PHYSICAL THERAPY DISCHARGE SUMMARY  Visits from Start of Care: 16.  Current functional level related to goals / functional outcomes: See above.   Remaining deficits: All goals met.   Education / Equipment: HEP. Plan:                                                    Patient goals were met.                                                  meeting the stated rehab goals.  ?????       Iceis Knab, CMaliMPT 08/18/2020, 4:39 PM  [facility] [address]Phone: [phone]  Fax:  [phone] Name: Melvin POPOFFMRN: [id number] of Birth: 3February 26, 1942

## 2024-05-10 ENCOUNTER — Emergency Department (HOSPITAL_COMMUNITY)

## 2024-05-10 ENCOUNTER — Other Ambulatory Visit: Payer: Self-pay

## 2024-05-10 ENCOUNTER — Emergency Department (HOSPITAL_COMMUNITY)
Admission: EM | Admit: 2024-05-10 | Discharge: 2024-05-10 | Disposition: A | Discharge status: Home / Self Care | Source: Home / Self Care | Attending: Emergency Medicine | Admitting: Emergency Medicine

## 2024-05-10 DIAGNOSIS — I471 Supraventricular tachycardia, unspecified: Secondary | ICD-10-CM

## 2024-05-10 LAB — COMPREHENSIVE METABOLIC PANEL WITH GFR
ALT: 29 U/L (ref 0–44)
AST: 29 U/L (ref 15–41)
Albumin: 4.1 g/dL (ref 3.5–5.0)
Alkaline Phosphatase: 68 U/L (ref 38–126)
Anion gap: 14 (ref 5–15)
BUN: 19 mg/dL (ref 8–23)
CO2: 22 mmol/L (ref 22–32)
Calcium: 9 mg/dL (ref 8.9–10.3)
Chloride: 103 mmol/L (ref 98–111)
Creatinine, Ser: 1.26 mg/dL — ABNORMAL HIGH (ref 0.61–1.24)
GFR, Estimated: 56 mL/min — ABNORMAL LOW
Glucose, Bld: 147 mg/dL — ABNORMAL HIGH (ref 70–99)
Potassium: 3.8 mmol/L (ref 3.5–5.1)
Sodium: 139 mmol/L (ref 135–145)
Total Bilirubin: 0.3 mg/dL (ref 0.0–1.2)
Total Protein: 6.8 g/dL (ref 6.5–8.1)

## 2024-05-10 LAB — CBC WITH DIFFERENTIAL/PLATELET
Abs Immature Granulocytes: 0.05 10*3/uL (ref 0.00–0.07)
Basophils Absolute: 0.1 10*3/uL (ref 0.0–0.1)
Basophils Relative: 1 %
Eosinophils Absolute: 0.1 10*3/uL (ref 0.0–0.5)
Eosinophils Relative: 1 %
HCT: 45.9 % (ref 39.0–52.0)
Hemoglobin: 15.9 g/dL (ref 13.0–17.0)
Immature Granulocytes: 1 %
Lymphocytes Relative: 22 %
Lymphs Abs: 1.5 10*3/uL (ref 0.7–4.0)
MCH: 32.4 pg (ref 26.0–34.0)
MCHC: 34.6 g/dL (ref 30.0–36.0)
MCV: 93.7 fL (ref 80.0–100.0)
Monocytes Absolute: 0.4 10*3/uL (ref 0.1–1.0)
Monocytes Relative: 6 %
Neutro Abs: 4.9 10*3/uL (ref 1.7–7.7)
Neutrophils Relative %: 69 %
Platelets: 173 10*3/uL (ref 150–400)
RBC: 4.9 MIL/uL (ref 4.22–5.81)
RDW: 14.6 % (ref 11.5–15.5)
WBC: 7 10*3/uL (ref 4.0–10.5)
nRBC: 0 % (ref 0.0–0.2)

## 2024-05-10 LAB — T4, FREE: Free T4: 1.13 ng/dL (ref 0.80–2.00)

## 2024-05-10 LAB — MAGNESIUM: Magnesium: 2 mg/dL (ref 1.7–2.4)

## 2024-05-10 LAB — TSH: TSH: 5.15 u[IU]/mL — ABNORMAL HIGH (ref 0.350–4.500)

## 2024-05-10 MED ORDER — SODIUM CHLORIDE 0.9 % IV BOLUS
1000.0000 mL | Freq: Once | INTRAVENOUS | Status: AC
  Administered 2024-05-10: 1000 mL via INTRAVENOUS

## 2024-05-10 NOTE — ED Provider Notes (Signed)
 " [facility] Provider Note   CSN: [id number] Arrival date & time: 05/10/24  1946     Patient presents with: Tachycardia   [name] is a 84 y.o. male.   84 yo M with a chief complaint of SVT.  Reportedly the daughter had checked his heart rate on a monitor and noted that it was in the 160s.  Was persistent for a few hours and then EMS was called.  Thought to be in SVT was electrically cardioverted by them.  Patient now has no complaints.  He seems a bit confused.  He denies any chest pain difficulty breathing nausea vomiting or diarrhea.  Denies headache or neck pain.  Feels like he has been eating and drinking normally.        Prior to Admission medications  Medication Sig Start Date End Date Taking? Authorizing Provider  Acetaminophen (TYLENOL PO) Take by mouth. As needed    [provider]  diltiazem  (CARDIZEM  CD) 180 MG 24 hr capsule Take 1 capsule (180 mg total) by mouth daily. 11/22/12   Georgina Nancyann ORN, MD  omeprazole  (PRILOSEC) 20 MG capsule Take 1 capsule (20 mg total) by mouth daily. 11/22/12   Georgina Nancyann ORN, MD    Allergies: Patient has no allergy information on record.    Review of Systems  Updated Vital Signs BP 91/69   Pulse 63   Temp 97.8 F (36.6 C) (Oral)   Resp 18   Ht 6' (1.829 m)   Wt 113.4 kg   SpO2 98%   BMI 33.91 kg/m   Physical Exam Vitals and nursing note reviewed.  Constitutional:      Appearance: He is well-developed.  HENT:     Head: Normocephalic and atraumatic.  Eyes:     Pupils: Pupils are equal, round, and reactive to light.  Neck:     Vascular: No JVD.  Cardiovascular:     Rate and Rhythm: Normal rate and regular rhythm.     Heart sounds: No murmur heard.    No friction rub. No gallop.  Pulmonary:     Effort: No respiratory distress.     Breath sounds: No wheezing.  Abdominal:     General: There is no distension.     Tenderness: There is no abdominal tenderness.  There is no guarding or rebound.  Musculoskeletal:        General: Normal range of motion.     Cervical back: Normal range of motion and neck supple.  Skin:    Coloration: Skin is not pale.     Findings: No rash.  Neurological:     Mental Status: He is alert and oriented to person, place, and time.  Psychiatric:        Behavior: Behavior normal.     (all labs ordered are listed, but only abnormal results are displayed) Labs Reviewed  COMPREHENSIVE METABOLIC PANEL WITH GFR - Abnormal; Notable for the following components:      Result Value   Glucose, Bld 147 (*)    Creatinine, Ser 1.26 (*)    GFR, Estimated 56 (*)    All other components within normal limits  TSH - Abnormal; Notable for the following components:   TSH 5.150 (*)    All other components within normal limits  CBC WITH DIFFERENTIAL/PLATELET  MAGNESIUM  T4, FREE    EKG: EKG Interpretation Date/Time:  Thursday May 10 2024 19:54:59 EST Ventricular Rate:  74 PR Interval:  193 QRS Duration:  157 QT Interval:  453 QTC Calculation: 503 R Axis:   -41  Text 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 No acute airspace disease, effusion, or pneumothorax. No acute bony abnormalities. IMPRESSION: 1. No acute intrathoracic process. Electronically Signed   By: Ozell Daring M.D.   On: 05/10/2024 20:22     Procedures   Medications Ordered in the ED  sodium chloride  0.9 % bolus 1,000 mL (1,000 mLs Intravenous New Bag/Given 05/10/24 2012)                                    Medical Decision Making Amount and/or Complexity of Data Reviewed Labs: ordered. Radiology: ordered. ECG/medicine tests: ordered.   84 yo M with a  chief complaints of SVT.  Patient is a poor historian and has trouble providing any history.  Per EMS was called out for rapid heart rate.  EMS felt like the patient was in SVT and so they electrically cardioverted him and route.  He arrives here normal sinus rhythm.  Has no complaints.  Workup here without significant electrolyte abnormalities.  His TSH is mildly elevated but his T4 is normal.  I discussed the results with the patient and family who is now arrived.  They tell me he has a significant history of atrial fibrillation.  I wonder if the  patient was in A-fib and was cardioverted by EMS and route.  I encouraged him to follow-up with his cardiologist in clinic.  He is on Eliquis and denies any missed dosages.  CHA2DS2/VAS Stroke Risk Points  Current as of 14 minutes ago     5 >= 2 Points: High Risk  1 to 1.99 Points: Medium Risk  0 Points: Low Risk    No Change      Details    This score determines the patient's risk of having a stroke if the  patient has atrial fibrillation.       Points Metrics  0 Has Congestive Heart Failure:  No    Current as of 14 minutes ago  1 Has Vascular Disease:  Yes     Current as of 14 minutes ago  0 Has Hypertension:  No    Current as of 14 minutes ago  2 Age:  2    Current as of 14 minutes ago  0 Has Diabetes Excluding Gestational Diabetes:  No    Current as of 14 minutes ago  2 Had Stroke:  Yes   Had TIA:  No  Had Thromboembolism:  No    Current as of 14 minutes ago  0 Male:  No    Current as of 14 minutes ago               Final diagnoses:  SVT (supraventricular tachycardia)    ED Discharge Orders     None          Emil Share, DO 05/10/24 2307  "

## 2024-05-10 NOTE — Discharge Instructions (Signed)
 Please call your cardiologist in the morning and let them know about your visit.  Please return for recurrent symptoms

## 2024-05-10 NOTE — ED Triage Notes (Signed)
 Pt BIB [facility] from home. Per EMS, pt was at 175 HR in SVT on arrival. EMS cardioverted pt 1 time at 100 jules and returned to [facility]. Per family, pt had inc HR and shortness of breath starting around 1800.
